# Patient Record
Sex: Female | Born: 1975 | Race: Black or African American | Hispanic: No | Marital: Single | Smoking: Current every day smoker
Health system: Southern US, Community
[De-identification: ages and names within clinical notes are randomized; demographics above are authoritative.]

## PROBLEM LIST (undated history)

## (undated) DIAGNOSIS — I10 Essential (primary) hypertension: Secondary | ICD-10-CM

## (undated) DIAGNOSIS — E785 Hyperlipidemia, unspecified: Secondary | ICD-10-CM

## (undated) DIAGNOSIS — J45909 Unspecified asthma, uncomplicated: Secondary | ICD-10-CM

## (undated) DIAGNOSIS — K5900 Constipation, unspecified: Secondary | ICD-10-CM

## (undated) DIAGNOSIS — E119 Type 2 diabetes mellitus without complications: Secondary | ICD-10-CM

## (undated) DIAGNOSIS — M549 Dorsalgia, unspecified: Secondary | ICD-10-CM

## (undated) DIAGNOSIS — K5909 Other constipation: Secondary | ICD-10-CM

## (undated) DIAGNOSIS — Z973 Presence of spectacles and contact lenses: Secondary | ICD-10-CM

## (undated) HISTORY — DX: Type 2 diabetes mellitus without complications: E11.9

## (undated) HISTORY — PX: HYSTEROSCOPY WITH NOVASURE: SHX5574

## (undated) HISTORY — DX: Essential (primary) hypertension: I10

## (undated) HISTORY — PX: ESSURE TUBAL LIGATION: SUR464

## (undated) HISTORY — DX: Hyperlipidemia, unspecified: E78.5

---

## 1998-02-25 ENCOUNTER — Encounter: Admission: RE | Admit: 1998-02-25 | Discharge: 1998-02-25 | Payer: Self-pay | Admitting: *Deleted

## 1999-03-06 ENCOUNTER — Emergency Department (HOSPITAL_COMMUNITY): Admission: EM | Admit: 1999-03-06 | Discharge: 1999-03-06 | Payer: Self-pay | Admitting: Emergency Medicine

## 1999-08-24 HISTORY — PX: COLPOSCOPY: SHX161

## 2009-11-21 ENCOUNTER — Emergency Department (HOSPITAL_COMMUNITY): Admission: EM | Admit: 2009-11-21 | Discharge: 2009-11-21 | Payer: Self-pay | Admitting: Family Medicine

## 2010-01-07 ENCOUNTER — Ambulatory Visit: Payer: Self-pay | Admitting: Obstetrics and Gynecology

## 2010-01-08 ENCOUNTER — Encounter: Payer: Self-pay | Admitting: Obstetrics and Gynecology

## 2010-01-08 LAB — CONVERTED CEMR LAB
Trich, Wet Prep: NONE SEEN
Yeast Wet Prep HPF POC: NONE SEEN

## 2010-01-12 ENCOUNTER — Ambulatory Visit (HOSPITAL_COMMUNITY): Admission: RE | Admit: 2010-01-12 | Discharge: 2010-01-12 | Payer: Self-pay | Admitting: Obstetrics & Gynecology

## 2010-02-12 ENCOUNTER — Ambulatory Visit: Payer: Self-pay | Admitting: Obstetrics & Gynecology

## 2010-10-16 ENCOUNTER — Emergency Department (HOSPITAL_COMMUNITY)
Admission: EM | Admit: 2010-10-16 | Discharge: 2010-10-16 | Disposition: A | Discharge status: Home / Self Care | Payer: Self-pay | Attending: Emergency Medicine | Admitting: Emergency Medicine

## 2010-10-16 ENCOUNTER — Emergency Department (HOSPITAL_COMMUNITY): Payer: Self-pay

## 2010-10-16 DIAGNOSIS — R0989 Other specified symptoms and signs involving the circulatory and respiratory systems: Secondary | ICD-10-CM | POA: Insufficient documentation

## 2010-10-16 DIAGNOSIS — R0609 Other forms of dyspnea: Secondary | ICD-10-CM | POA: Insufficient documentation

## 2010-10-16 DIAGNOSIS — I10 Essential (primary) hypertension: Secondary | ICD-10-CM | POA: Insufficient documentation

## 2010-10-16 DIAGNOSIS — J069 Acute upper respiratory infection, unspecified: Secondary | ICD-10-CM | POA: Insufficient documentation

## 2010-10-16 DIAGNOSIS — B9789 Other viral agents as the cause of diseases classified elsewhere: Secondary | ICD-10-CM | POA: Insufficient documentation

## 2010-10-16 DIAGNOSIS — R0789 Other chest pain: Secondary | ICD-10-CM | POA: Insufficient documentation

## 2010-10-16 DIAGNOSIS — R059 Cough, unspecified: Secondary | ICD-10-CM | POA: Insufficient documentation

## 2010-10-16 DIAGNOSIS — R112 Nausea with vomiting, unspecified: Secondary | ICD-10-CM | POA: Insufficient documentation

## 2010-10-16 DIAGNOSIS — R05 Cough: Secondary | ICD-10-CM | POA: Insufficient documentation

## 2010-10-16 DIAGNOSIS — R0602 Shortness of breath: Secondary | ICD-10-CM | POA: Insufficient documentation

## 2010-10-16 LAB — URINALYSIS, ROUTINE W REFLEX MICROSCOPIC
Bilirubin Urine: NEGATIVE
Ketones, ur: NEGATIVE mg/dL
Specific Gravity, Urine: 1.015 (ref 1.005–1.030)
Urobilinogen, UA: 0.2 mg/dL (ref 0.0–1.0)

## 2010-10-21 ENCOUNTER — Emergency Department (HOSPITAL_COMMUNITY)
Admission: EM | Admit: 2010-10-21 | Discharge: 2010-10-21 | Disposition: A | Discharge status: Home / Self Care | Payer: Self-pay | Attending: Emergency Medicine | Admitting: Emergency Medicine

## 2010-10-21 DIAGNOSIS — I10 Essential (primary) hypertension: Secondary | ICD-10-CM | POA: Insufficient documentation

## 2010-10-21 DIAGNOSIS — R112 Nausea with vomiting, unspecified: Secondary | ICD-10-CM | POA: Insufficient documentation

## 2010-10-21 DIAGNOSIS — R040 Epistaxis: Secondary | ICD-10-CM | POA: Insufficient documentation

## 2010-10-21 DIAGNOSIS — J40 Bronchitis, not specified as acute or chronic: Secondary | ICD-10-CM | POA: Insufficient documentation

## 2010-10-21 DIAGNOSIS — R05 Cough: Secondary | ICD-10-CM | POA: Insufficient documentation

## 2010-10-21 DIAGNOSIS — R059 Cough, unspecified: Secondary | ICD-10-CM | POA: Insufficient documentation

## 2010-10-30 ENCOUNTER — Inpatient Hospital Stay (INDEPENDENT_AMBULATORY_CARE_PROVIDER_SITE_OTHER)
Admission: RE | Admit: 2010-10-30 | Discharge: 2010-10-30 | Disposition: A | Discharge status: Home / Self Care | Payer: Self-pay | Source: Ambulatory Visit | Attending: Emergency Medicine | Admitting: Emergency Medicine

## 2010-10-30 DIAGNOSIS — H8110 Benign paroxysmal vertigo, unspecified ear: Secondary | ICD-10-CM

## 2011-08-24 DIAGNOSIS — F419 Anxiety disorder, unspecified: Secondary | ICD-10-CM

## 2011-08-24 HISTORY — DX: Anxiety disorder, unspecified: F41.9

## 2012-03-13 ENCOUNTER — Other Ambulatory Visit: Payer: Self-pay

## 2012-03-13 MED ORDER — FLUCONAZOLE 150 MG PO TABS: 150.0000 mg | ORAL_TABLET | Freq: Once | ORAL | Status: AC

## 2012-03-13 MED ORDER — METRONIDAZOLE 500 MG PO TABS: 500.0000 mg | ORAL_TABLET | Freq: Two times a day (BID) | ORAL | Status: AC

## 2012-03-13 NOTE — Telephone Encounter (Signed)
Pt called and stated that she is having a discharge with a fishy odor and she informed me that she usually has BV.  I advised pt that I would e-prescribe her flagyl for tx of BV and diflucan since pt stated that she usually gets a yeast infection after taking antibiotics.  I verified pharmacy with the pt.  Pt stated understanding and had no further questions.

## 2012-03-28 ENCOUNTER — Telehealth: Payer: Self-pay | Admitting: *Deleted

## 2012-03-28 NOTE — Telephone Encounter (Signed)
Pt left message stating that she has appt on 04/10/12 @ 1615. She has had a Mirena IUD x2 yrs. She  has been bleeding for 2 wks now and wants to know if there is anything she can get to stop the bleeding.  She has had this problem of irregular bleeding for a long time and wants it to stop. I called pt and left a message that she will need to be evaluated to best determine the proper care which may or may not be medication. There is an appt available on 8/8 @ 1530 [facility]) if she would like to change her appt please call to the appt desk and request the change.

## 2012-03-30 ENCOUNTER — Ambulatory Visit: Payer: Self-pay | Admitting: Physician Assistant

## 2012-04-10 ENCOUNTER — Ambulatory Visit: Payer: Self-pay | Admitting: Obstetrics & Gynecology

## 2012-04-28 ENCOUNTER — Ambulatory Visit: Payer: Self-pay | Admitting: Advanced Practice Midwife

## 2012-07-17 ENCOUNTER — Other Ambulatory Visit: Payer: Self-pay | Admitting: Obstetrics and Gynecology

## 2012-07-17 ENCOUNTER — Other Ambulatory Visit (HOSPITAL_COMMUNITY)
Admission: RE | Admit: 2012-07-17 | Discharge: 2012-07-17 | Disposition: A | Discharge status: Home / Self Care | Payer: Medicaid Other | Source: Ambulatory Visit | Attending: Obstetrics and Gynecology | Admitting: Obstetrics and Gynecology

## 2012-07-17 ENCOUNTER — Encounter: Payer: Self-pay | Admitting: Obstetrics and Gynecology

## 2012-07-17 ENCOUNTER — Ambulatory Visit (INDEPENDENT_AMBULATORY_CARE_PROVIDER_SITE_OTHER): Payer: Medicaid Other | Admitting: Obstetrics and Gynecology

## 2012-07-17 VITALS — BP 119/79 | HR 90 | Temp 97.4°F | Ht 64.0 in | Wt 190.0 lb

## 2012-07-17 DIAGNOSIS — Z01419 Encounter for gynecological examination (general) (routine) without abnormal findings: Secondary | ICD-10-CM

## 2012-07-17 DIAGNOSIS — Z1231 Encounter for screening mammogram for malignant neoplasm of breast: Secondary | ICD-10-CM

## 2012-07-17 DIAGNOSIS — Z803 Family history of malignant neoplasm of breast: Secondary | ICD-10-CM

## 2012-07-17 DIAGNOSIS — N760 Acute vaginitis: Secondary | ICD-10-CM | POA: Insufficient documentation

## 2012-07-17 DIAGNOSIS — Z8742 Personal history of other diseases of the female genital tract: Secondary | ICD-10-CM

## 2012-07-17 DIAGNOSIS — F17200 Nicotine dependence, unspecified, uncomplicated: Secondary | ICD-10-CM | POA: Insufficient documentation

## 2012-07-17 DIAGNOSIS — Z1151 Encounter for screening for human papillomavirus (HPV): Secondary | ICD-10-CM | POA: Insufficient documentation

## 2012-07-17 DIAGNOSIS — Z87898 Personal history of other specified conditions: Secondary | ICD-10-CM

## 2012-07-17 DIAGNOSIS — Z113 Encounter for screening for infections with a predominantly sexual mode of transmission: Secondary | ICD-10-CM | POA: Insufficient documentation

## 2012-07-17 NOTE — Patient Instructions (Addendum)
Smoking Cessation Quitting smoking is important to your health and has many advantages. However, it is not always easy to quit since nicotine is a very addictive drug. Often times, people try 3 times or more before being able to quit. This document explains the best ways for you to prepare to quit smoking. Quitting takes hard work and a lot of effort, but you can do it. ADVANTAGES OF QUITTING SMOKING  You will live longer, feel better, and live better.  Your body will feel the impact of quitting smoking almost immediately.  Within 20 minutes, blood pressure decreases. Your pulse returns to its normal level.  After 8 hours, carbon monoxide levels in the blood return to normal. Your oxygen level increases.  After 24 hours, the chance of having a heart attack starts to decrease. Your breath, hair, and body stop smelling like smoke.  After 48 hours, damaged nerve endings begin to recover. Your sense of taste and smell improve.  After 72 hours, the body is virtually free of nicotine. Your bronchial tubes relax and breathing becomes easier.  After 2 to 12 weeks, lungs can hold more air. Exercise becomes easier and circulation improves.  The risk of having a heart attack, stroke, cancer, or lung disease is greatly reduced.  After 1 year, the risk of coronary heart disease is cut in half.  After 5 years, the risk of stroke falls to the same as a nonsmoker.  After 10 years, the risk of lung cancer is cut in half and the risk of other cancers decreases significantly.  After 15 years, the risk of coronary heart disease drops, usually to the level of a nonsmoker.  If you are pregnant, quitting smoking will improve your chances of having a healthy baby.  The people you live with, especially any children, will be healthier.  You will have extra money to spend on things other than cigarettes. QUESTIONS TO THINK ABOUT BEFORE ATTEMPTING TO QUIT You may want to talk about your answers with your  caregiver.  Why do you want to quit?  If you tried to quit in the past, what helped and what did not?  What will be the most difficult situations for you after you quit? How will you plan to handle them?  Who can help you through the tough times? Your family? Friends? A caregiver?  What pleasures do you get from smoking? What ways can you still get pleasure if you quit? Here are some questions to ask your caregiver:  How can you help me to be successful at quitting?  What medicine do you think would be best for me and how should I take it?  What should I do if I need more help?  What is smoking withdrawal like? How can I get information on withdrawal? GET READY  Set a quit date.  Change your environment by getting rid of all cigarettes, ashtrays, matches, and lighters in your home, car, or work. Do not let people smoke in your home.  Review your past attempts to quit. Think about what worked and what did not. GET SUPPORT AND ENCOURAGEMENT You have a better chance of being successful if you have help. You can get support in many ways.  Tell your family, friends, and co-workers that you are going to quit and need their support. Ask them not to smoke around you.  Get individual, group, or telephone counseling and support. Programs are available at local hospitals and health centers. Call your local health department for   information about programs in your area.  Spiritual beliefs and practices may help some smokers quit.  Download a "quit meter" on your computer to keep track of quit statistics, such as how long you have gone without smoking, cigarettes not smoked, and money saved.  Get a self-help book about quitting smoking and staying off of tobacco. LEARN NEW SKILLS AND BEHAVIORS  Distract yourself from urges to smoke. Talk to someone, go for a walk, or occupy your time with a task.  Change your normal routine. Take a different route to work. Drink tea instead of coffee.  Eat breakfast in a different place.  Reduce your stress. Take a hot bath, exercise, or read a book.  Plan something enjoyable to do every day. Reward yourself for not smoking.  Explore interactive web-based programs that specialize in helping you quit. GET MEDICINE AND USE IT CORRECTLY Medicines can help you stop smoking and decrease the urge to smoke. Combining medicine with the above behavioral methods and support can greatly increase your chances of successfully quitting smoking.  Nicotine replacement therapy helps deliver nicotine to your body without the negative effects and risks of smoking. Nicotine replacement therapy includes nicotine gum, lozenges, inhalers, nasal sprays, and skin patches. Some may be available over-the-counter and others require a prescription.  Antidepressant medicine helps people abstain from smoking, but how this works is unknown. This medicine is available by prescription.  Nicotinic receptor partial agonist medicine simulates the effect of nicotine in your brain. This medicine is available by prescription. Ask your caregiver for advice about which medicines to use and how to use them based on your health history. Your caregiver will tell you what side effects to look out for if you choose to be on a medicine or therapy. Carefully read the information on the package. Do not use any other product containing nicotine while using a nicotine replacement product.  RELAPSE OR DIFFICULT SITUATIONS Most relapses occur within the first 3 months after quitting. Do not be discouraged if you start smoking again. Remember, most people try several times before finally quitting. You may have symptoms of withdrawal because your body is used to nicotine. You may crave cigarettes, be irritable, feel very hungry, cough often, get headaches, or have difficulty concentrating. The withdrawal symptoms are only temporary. They are strongest when you first quit, but they will go away within  10 14 days. To reduce the chances of relapse, try to:  Avoid drinking alcohol. Drinking lowers your chances of successfully quitting.  Reduce the amount of caffeine you consume. Once you quit smoking, the amount of caffeine in your body increases and can give you symptoms, such as a rapid heartbeat, sweating, and anxiety.  Avoid smokers because they can make you want to smoke.  Do not let weight gain distract you. Many smokers will gain weight when they quit, usually less than 10 pounds. Eat a healthy diet and stay active. You can always lose the weight gained after you quit.  Find ways to improve your mood other than smoking. FOR MORE INFORMATION  www.smokefree.gov  Document Released: 08/03/2001 Document Revised: 02/08/2012 Document Reviewed: 11/18/2011 [facility] Patient Information 2013 [place], [place]. Mammogram Tips Healthy women should begin getting mammograms every year or two once they reach age 14, and once a year when they reach age 23. Here are tips:  Find an experienced, high-volume center with accomplished radiologists. You can ask for their credentials.  Ask to see the certificate showing the center is approved by the [place]  Food and Drug Administration.  Use the same center regularly, so it is easier to compare your new mammograms with your old ones.  Bring a list of places you have had mammograms, dates, biopsies or other breast treatments. Bring old mammograms with you or have them sent to your primary caregiver.  Describe any breast problems to your caregiver or the person doing the mammogram. Be ready to give past surgeries, birth control pills, hormone use, breast implants, growths, moles, breast scars and family or personal history of breast cancer.  Call your doctor or center to check on the mammogram if you hear nothing within 10 days. Do not assume everything was normal.  To protect your privacy, the mammogram results cannot be given over the phone or to anyone but  you.  Radiation from a mammogram is very low and does not pose a radiation risk.  Mammograms can detect breast problems other than breast cancer.  You may be asked stand or sit in front of the X-ray machine.  Two small plastic or glass plates are placed around the breast when taking the X-ray.  If you are menstruating, schedule your mammogram a week after your menstrual period.  Do not wear deodorants, powder or perfume when getting a mammogram.  Wash your breasts and under your arms before getting a mammogram.  Wear cloths that are easy for you to undress and dress.  Arrive at the center at least 15 minutes before the mammogram is scheduled.  There may be slight discomfort during the mammogram, but it goes away shortly after the test.  Try to relax as much as possible during the mammogram.  Talk to your caregiver if you do not understand the results of the mammogram.  Follow the recommendations of your caregiver regarding further tests and treatments if needed.  Get a second opinion if you are concerned or question the results of the mammogram, further tests or treatment if needed.  Continue with monthly self-breast exams and yearly caregiver exams even if the mammogram is normal.  Your caregiver may recommend getting a mammogram before age 4 and more often if you are at high risk for developing breast cancer. Document Released: 11/25/2005 Document Revised: 11/01/2011 Document Reviewed: 08/02/2008 [facility] Patient Information 2013 [place], [place].

## 2012-07-18 LAB — HIV ANTIBODY (ROUTINE TESTING W REFLEX): HIV: NONREACTIVE

## 2012-07-18 NOTE — Progress Notes (Signed)
CC: Gynecologic Exam e     HPI Michaela Thomas is a 36 y.o. [id number]  who presents for Pap and GYN exam. States she had colpo for abnormal Pap in 2001 and in 2011 and last Pap a year ago was normal. Desires GC/CT, HIV also. Also has never had baseline screening mammogram or BRCA genetic testing and mother had breast Ca in her 49's. Smoker somewhat motivated to quit. Has had Mirena for about a year and has light periods. LMP 3-4 wks ago.  Denies irritative vaginal discharge or new sexual partner.  Hx of elevated BP and was on HCTZ in past. In process of finding PMD as she just moved back here.    Past Medical History  Diagnosis Date  . Hypertension   FH significant for mother with breast cancer at age 61.  OB History    Grav Para Term Preterm Abortions TAB SAB Ect Mult Living   2 2 2  0 0 0 0 0 0 2     # Outc Date GA Lbr Len/2nd Wgt Sex Del Anes PTL Lv   1 TRM    6lb(2.722kg)  SVD      2 TRM    6lb(2.722kg)  SVD         Past Surgical History  Procedure Date  . Colposcopy 2001    History   Social History  . Marital Status: Single    Spouse Name: N/A    Number of Children: N/A  . Years of Education: N/A   Occupational History  . Not on file.   Social History Main Topics  . Smoking status: Current Every Day Smoker    Types: Cigarettes  . Smokeless tobacco: Never Used  . Alcohol Use: 1.0 oz/week    2 drink(s) per week  . Drug Use: No  . Sexually Active: Yes    Birth Control/ Protection: IUD   Other Topics Concern  . Not on file   Social History Narrative  . No narrative on file    Current Outpatient Prescriptions on File Prior to Visit  Medication Sig Dispense Refill  . hydrochlorothiazide (HYDRODIURIL) 25 MG tablet Take 25 mg by mouth daily.        No Known Allergies  ROS Pertinent items in HPI  PHYSICAL EXAM Filed Vitals:   07/17/12 1559  BP: 119/79  Pulse:   Temp:    General: Well nourished, well developed female in no acute distress Cardiovascular:  Normal rate Respiratory: Normal effort Abdomen: Soft, nontender Back: No CVAT Extremities: No edema Neurologic: Alert and oriented Speculum exam: NEFG; vagina with physiologic discharge, small amt dark blood from os with Pap blood; cervix clean with Mirena string 1 cm past ext os Bimanual exam: cervix closed, no CMT; uterus NSSP; no adnexal tenderness or masses  LAB RESULTS Results for orders placed in visit on 07/17/12 (from the past 24 hour(s))  HIV ANTIBODY (ROUTINE TESTING)     Status: Normal   Collection Time   07/17/12  4:59 PM      Component Value Range   HIV NON REACTIVE  NON REACTIVE   Narrative:    Performed at:  [facility]                [address]                [place], [place] [address]    I ASSESSMENT  1. Smoker   2. Family history of breast cancer   3.  History of abnormal Pap smear   Hx BP elevation  PLAN Pap, GC/CT, HIV done. See AVS for patient education. Information on BRCA genetic testing given Return 1 year or prn.  [name], CNM 07/18/2012 9:42 AM

## 2012-08-03 ENCOUNTER — Ambulatory Visit (HOSPITAL_COMMUNITY)
Admission: RE | Admit: 2012-08-03 | Discharge: 2012-08-03 | Disposition: A | Discharge status: Home / Self Care | Payer: Medicaid Other | Source: Ambulatory Visit | Attending: Obstetrics and Gynecology | Admitting: Obstetrics and Gynecology

## 2012-08-03 DIAGNOSIS — Z1231 Encounter for screening mammogram for malignant neoplasm of breast: Secondary | ICD-10-CM | POA: Insufficient documentation

## 2012-08-03 DIAGNOSIS — Z803 Family history of malignant neoplasm of breast: Secondary | ICD-10-CM

## 2012-08-10 ENCOUNTER — Other Ambulatory Visit: Payer: Self-pay | Admitting: Obstetrics and Gynecology

## 2012-08-10 DIAGNOSIS — R928 Other abnormal and inconclusive findings on diagnostic imaging of breast: Secondary | ICD-10-CM

## 2012-08-21 ENCOUNTER — Other Ambulatory Visit: Payer: Medicaid Other

## 2012-08-30 ENCOUNTER — Emergency Department (HOSPITAL_COMMUNITY)
Admission: EM | Admit: 2012-08-30 | Discharge: 2012-08-30 | Disposition: A | Discharge status: Home / Self Care | Payer: 59 | Attending: Emergency Medicine | Admitting: Emergency Medicine

## 2012-08-30 ENCOUNTER — Emergency Department (HOSPITAL_COMMUNITY): Payer: 59

## 2012-08-30 ENCOUNTER — Encounter (HOSPITAL_COMMUNITY): Payer: Self-pay | Admitting: Emergency Medicine

## 2012-08-30 DIAGNOSIS — J111 Influenza due to unidentified influenza virus with other respiratory manifestations: Secondary | ICD-10-CM

## 2012-08-30 DIAGNOSIS — F17200 Nicotine dependence, unspecified, uncomplicated: Secondary | ICD-10-CM | POA: Insufficient documentation

## 2012-08-30 DIAGNOSIS — R05 Cough: Secondary | ICD-10-CM | POA: Insufficient documentation

## 2012-08-30 DIAGNOSIS — R0602 Shortness of breath: Secondary | ICD-10-CM | POA: Insufficient documentation

## 2012-08-30 DIAGNOSIS — I10 Essential (primary) hypertension: Secondary | ICD-10-CM | POA: Insufficient documentation

## 2012-08-30 DIAGNOSIS — R059 Cough, unspecified: Secondary | ICD-10-CM | POA: Insufficient documentation

## 2012-08-30 MED ORDER — KETOROLAC TROMETHAMINE 60 MG/2ML IM SOLN
60.0000 mg | Freq: Once | INTRAMUSCULAR | Status: AC
  Administered 2012-08-30: 60 mg via INTRAMUSCULAR
  Filled 2012-08-30: qty 2

## 2012-08-30 MED ORDER — NAPROXEN 500 MG PO TABS: 500.0000 mg | ORAL_TABLET | Freq: Two times a day (BID) | ORAL | Status: DC

## 2012-08-30 MED ORDER — OSELTAMIVIR PHOSPHATE 75 MG PO CAPS: 75.0000 mg | ORAL_CAPSULE | Freq: Two times a day (BID) | ORAL | Status: DC

## 2012-08-30 NOTE — ED Provider Notes (Signed)
History     CSN: [id number]  Arrival date & time 08/30/12  0251   First MD Initiated Contact with Patient 08/30/12 [phone]      Chief Complaint  Patient presents with  . Cough  . Shortness of Breath    (Consider location/radiation/quality/duration/timing/severity/associated sxs/prior treatment) HPI Comments: 37 year old female who presents with approximately one day of productive cough, sore throat and shortness of breath who has now developed approximately 2 hours of myalgias. Her symptoms are constant, gradually worsening, not associated with fever though she has had subjective chills. She denies nausea vomiting diarrhea swelling rashes numbness. She does have a mild headache. Nothing seems to make this better or worse. Her symptoms were gradual in onset.  Patient is a 37 y.o. female presenting with cough and shortness of breath. The history is provided by the patient.  Cough Associated symptoms include shortness of breath.  Shortness of Breath  Associated symptoms include cough and shortness of breath.    Past Medical History  Diagnosis Date  . Hypertension     Past Surgical History  Procedure Date  . Colposcopy 2001    Family History  Problem Relation Age of Onset  . Cancer Mother 52    breast  . Cancer Father     prostate  . Hypertension Father   . Diabetes Father     History  Substance Use Topics  . Smoking status: Current Every Day Smoker    Types: Cigarettes  . Smokeless tobacco: Never Used  . Alcohol Use: 1.0 oz/week    2 drink(s) per week    OB History    Grav Para Term Preterm Abortions TAB SAB Ect Mult Living   2 2 2  0 0 0 0 0 0 2      Review of Systems  Respiratory: Positive for cough and shortness of breath.   All other systems reviewed and are negative.    Allergies  Review of patient's allergies indicates no known allergies.  Home Medications   Current Outpatient Rx  Name  Route  Sig  Dispense  Refill  . HYDROCHLOROTHIAZIDE 25 MG PO  TABS   Oral   Take 25 mg by mouth daily.         Marland Kitchen NAPROXEN 500 MG PO TABS   Oral   Take 1 tablet (500 mg total) by mouth 2 (two) times daily with a meal.   30 tablet   0   . OSELTAMIVIR PHOSPHATE 75 MG PO CAPS   Oral   Take 1 capsule (75 mg total) by mouth every 12 (twelve) hours.   10 capsule   0     BP 122/80  Pulse 61  Temp 99.1 F (37.3 C) (Oral)  Resp 18  SpO2 98%  LMP 08/06/2012  Physical Exam  Nursing note and vitals reviewed. Constitutional: She appears well-developed and well-nourished. No distress.  HENT:  Head: Normocephalic and atraumatic.  Mouth/Throat: No oropharyngeal exudate.       Mild erythema of the posterior pharynx, no exudate hypertrophy asymmetry. Nasal passages are clear, mucous membranes are moist. Tympanic membranes are clear.  Eyes: Conjunctivae normal and EOM are normal. Pupils are equal, round, and reactive to light. Right eye exhibits no discharge. Left eye exhibits no discharge. No scleral icterus.  Neck: Normal range of motion. Neck supple. No JVD present. No thyromegaly present.  Cardiovascular: Normal rate, regular rhythm, normal heart sounds and intact distal pulses.  Exam reveals no gallop and no friction rub.   No  murmur heard. Pulmonary/Chest: Effort normal and breath sounds normal. No respiratory distress. She has no wheezes. She has no rales.  Abdominal: Soft. Bowel sounds are normal. She exhibits no distension and no mass. There is no tenderness.  Musculoskeletal: Normal range of motion. She exhibits no edema and no tenderness.  Lymphadenopathy:    She has no cervical adenopathy.  Neurological: She is alert. Coordination normal.  Skin: Skin is warm and dry. No rash noted. No erythema.  Psychiatric: She has a normal mood and affect. Her behavior is normal.    ED Course  Procedures (including critical care time)  Labs Reviewed - No data to display Dg Chest 2 View  08/30/2012  *RADIOLOGY REPORT*  Clinical Data: Cough and  chest pain.  CHEST - 2 VIEW  Comparison: PA and lateral chest 10/16/2010.  Findings: Lungs clear.  Heart size normal.  No pneumothorax or pleural fluid.  IMPRESSION: Negative chest.   Original Report Authenticated By: Holley Dexter, M.D.      1. Influenza-like illness       MDM  Lungs are clear, no wheezing rales or rhonchi, no increased work of breathing and oxygen saturations are 100%. She does have a pharyngitis, she has a cough and now she has myalgias. Her symptoms are consistent with an influenza-like illness. She will be given Tamiflu prescription to use as needed, Toradol, home. She understands indications for return.  PA and lateral views of the chest were obtained by digital radiography. I have personally interpreted these x-rays and find her to be no signs of pulmonary infiltrate, cardiomegaly, subdiaphragmatic free air, soft tissue abnormality, no obvious bony abnormalities or fractures.      Vida Roller, MD 08/30/12 [phone]

## 2012-08-30 NOTE — ED Notes (Signed)
Patient complaining of sore throat, productive cough (with green mucous production), shortness of breath upon exertion, and earache.  Patient reports history of asthmatic bronchitis.

## 2012-09-12 ENCOUNTER — Other Ambulatory Visit: Payer: Medicaid Other

## 2012-09-26 ENCOUNTER — Ambulatory Visit
Admission: RE | Admit: 2012-09-26 | Discharge: 2012-09-26 | Disposition: A | Discharge status: Home / Self Care | Payer: Medicaid Other | Source: Ambulatory Visit | Attending: Obstetrics and Gynecology | Admitting: Obstetrics and Gynecology

## 2012-09-26 ENCOUNTER — Ambulatory Visit
Admission: RE | Admit: 2012-09-26 | Discharge: 2012-09-26 | Disposition: A | Discharge status: Home / Self Care | Payer: 59 | Source: Ambulatory Visit | Attending: Obstetrics and Gynecology | Admitting: Obstetrics and Gynecology

## 2012-09-26 DIAGNOSIS — R928 Other abnormal and inconclusive findings on diagnostic imaging of breast: Secondary | ICD-10-CM

## 2013-09-18 ENCOUNTER — Emergency Department (HOSPITAL_BASED_OUTPATIENT_CLINIC_OR_DEPARTMENT_OTHER)
Admission: EM | Admit: 2013-09-18 | Discharge: 2013-09-18 | Discharge status: Against Medical Advice | Payer: Medicaid Other | Attending: Emergency Medicine | Admitting: Emergency Medicine

## 2013-09-18 ENCOUNTER — Encounter (HOSPITAL_BASED_OUTPATIENT_CLINIC_OR_DEPARTMENT_OTHER): Payer: Self-pay | Admitting: Emergency Medicine

## 2013-09-18 DIAGNOSIS — I10 Essential (primary) hypertension: Secondary | ICD-10-CM | POA: Insufficient documentation

## 2013-09-18 DIAGNOSIS — F17200 Nicotine dependence, unspecified, uncomplicated: Secondary | ICD-10-CM | POA: Insufficient documentation

## 2013-09-18 DIAGNOSIS — R51 Headache: Secondary | ICD-10-CM | POA: Insufficient documentation

## 2013-09-18 NOTE — ED Notes (Signed)
Pt c/o h/a x 4 hrs

## 2013-10-08 DIAGNOSIS — B009 Herpesviral infection, unspecified: Secondary | ICD-10-CM | POA: Insufficient documentation

## 2013-12-06 ENCOUNTER — Other Ambulatory Visit: Payer: Self-pay

## 2013-12-06 DIAGNOSIS — Z1231 Encounter for screening mammogram for malignant neoplasm of breast: Secondary | ICD-10-CM

## 2013-12-20 ENCOUNTER — Ambulatory Visit
Admission: RE | Admit: 2013-12-20 | Discharge: 2013-12-20 | Disposition: A | Discharge status: Home / Self Care | Payer: 59 | Source: Ambulatory Visit

## 2013-12-20 DIAGNOSIS — Z1231 Encounter for screening mammogram for malignant neoplasm of breast: Secondary | ICD-10-CM

## 2014-06-24 ENCOUNTER — Encounter (HOSPITAL_BASED_OUTPATIENT_CLINIC_OR_DEPARTMENT_OTHER): Payer: Self-pay | Admitting: Emergency Medicine

## 2014-12-04 ENCOUNTER — Ambulatory Visit: Payer: Medicaid Other | Admitting: Obstetrics & Gynecology

## 2015-02-19 ENCOUNTER — Other Ambulatory Visit (HOSPITAL_COMMUNITY): Payer: Self-pay | Admitting: Obstetrics and Gynecology

## 2015-02-19 DIAGNOSIS — Z308 Encounter for other contraceptive management: Secondary | ICD-10-CM

## 2015-05-14 ENCOUNTER — Ambulatory Visit (HOSPITAL_COMMUNITY)
Admission: RE | Admit: 2015-05-14 | Discharge: 2015-05-14 | Disposition: A | Discharge status: Home / Self Care | Payer: PRIVATE HEALTH INSURANCE | Source: Ambulatory Visit | Attending: Obstetrics and Gynecology | Admitting: Obstetrics and Gynecology

## 2015-07-09 ENCOUNTER — Ambulatory Visit: Payer: PRIVATE HEALTH INSURANCE | Admitting: Physician Assistant

## 2015-08-24 HISTORY — PX: ESSURE TUBAL LIGATION: SUR464

## 2016-04-30 ENCOUNTER — Emergency Department (HOSPITAL_COMMUNITY)
Admission: EM | Admit: 2016-04-30 | Discharge: 2016-04-30 | Disposition: A | Discharge status: Home / Self Care | Payer: No Typology Code available for payment source | Attending: Dermatology | Admitting: Dermatology

## 2016-04-30 ENCOUNTER — Encounter (HOSPITAL_COMMUNITY): Payer: Self-pay | Admitting: *Deleted

## 2016-04-30 DIAGNOSIS — M542 Cervicalgia: Secondary | ICD-10-CM | POA: Diagnosis not present

## 2016-04-30 DIAGNOSIS — M545 Low back pain: Secondary | ICD-10-CM | POA: Diagnosis present

## 2016-04-30 DIAGNOSIS — F17210 Nicotine dependence, cigarettes, uncomplicated: Secondary | ICD-10-CM | POA: Diagnosis not present

## 2016-04-30 DIAGNOSIS — Y939 Activity, unspecified: Secondary | ICD-10-CM | POA: Diagnosis not present

## 2016-04-30 DIAGNOSIS — Y92410 Unspecified street and highway as the place of occurrence of the external cause: Secondary | ICD-10-CM | POA: Diagnosis not present

## 2016-04-30 DIAGNOSIS — Z5321 Procedure and treatment not carried out due to patient leaving prior to being seen by health care provider: Secondary | ICD-10-CM | POA: Diagnosis not present

## 2016-04-30 DIAGNOSIS — Y999 Unspecified external cause status: Secondary | ICD-10-CM | POA: Insufficient documentation

## 2016-04-30 DIAGNOSIS — I10 Essential (primary) hypertension: Secondary | ICD-10-CM | POA: Diagnosis not present

## 2016-04-30 NOTE — ED Notes (Signed)
Pt states she will come back tomorrow if she needs to,a mbulated out of ER without issue.
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
## 2016-04-30 NOTE — ED Notes (Signed)
Unable to locate pt. at subwaiting area several times .

## 2016-07-21 ENCOUNTER — Ambulatory Visit: Payer: Self-pay | Admitting: Physician Assistant

## 2016-07-21 ENCOUNTER — Encounter: Payer: Self-pay | Admitting: Physician Assistant

## 2016-07-21 VITALS — BP 140/90 | HR 74 | Temp 98.2°F

## 2016-07-21 DIAGNOSIS — J0100 Acute maxillary sinusitis, unspecified: Secondary | ICD-10-CM

## 2016-07-21 MED ORDER — FLUCONAZOLE 150 MG PO TABS: 150.0000 mg | ORAL_TABLET | Freq: Once | ORAL | 0 refills | Status: AC

## 2016-07-21 MED ORDER — AMOXICILLIN 875 MG PO TABS: 875.0000 mg | ORAL_TABLET | Freq: Two times a day (BID) | ORAL | 0 refills | Status: DC

## 2016-07-21 MED ORDER — PREDNISONE 10 MG PO TABS: 30.0000 mg | ORAL_TABLET | Freq: Every day | ORAL | 0 refills | Status: DC

## 2016-07-21 NOTE — Progress Notes (Signed)
S: C/o runny nose and congestion for 3 days, no fever, chills, cp/sob, v/d; mucus is green and thick, cough is sporadic, c/o of facial and dental pain.   Using otc meds:   O: PE: vitals wnl, nad, perrl eomi, normocephalic, tms dull, r tm is red and swollen, nasal mucosa red and swollen, throat injected, neck supple no lymph, lungs c t a, cv rrr, neuro intact  A:  Acute sinusitis, AOM r ear   P: drink fluids, continue regular meds , use otc meds of choice, return if not improving in 5 days, return earlier if worsening , amoxil, prednisone 30mg  qd x 3d, diflucan

## 2016-08-13 ENCOUNTER — Ambulatory Visit: Payer: Self-pay | Admitting: Physician Assistant

## 2016-08-13 VITALS — BP 120/80 | HR 82 | Temp 98.3°F

## 2016-08-13 DIAGNOSIS — R110 Nausea: Secondary | ICD-10-CM

## 2016-08-13 LAB — POCT URINE PREGNANCY: Preg Test, Ur: NEGATIVE

## 2016-08-13 MED ORDER — ONDANSETRON HCL 4 MG PO TABS: 4.0000 mg | ORAL_TABLET | Freq: Three times a day (TID) | ORAL | 0 refills | Status: DC | PRN

## 2016-08-13 MED ORDER — SUMATRIPTAN SUCCINATE 100 MG PO TABS: ORAL_TABLET | ORAL | 0 refills | Status: DC

## 2016-08-14 LAB — CMP12+LP+TP+TSH+6AC+CBC/D/PLT
ALBUMIN: 4.5 g/dL (ref 3.5–5.5)
ALT: 37 IU/L — AB (ref 0–32)
AST: 28 IU/L (ref 0–40)
Albumin/Globulin Ratio: 1.6 (ref 1.2–2.2)
Alkaline Phosphatase: 65 IU/L (ref 39–117)
BASOS: 0 %
BUN/Creatinine Ratio: 18 (ref 9–23)
BUN: 13 mg/dL (ref 6–20)
Basophils Absolute: 0 10*3/uL (ref 0.0–0.2)
Bilirubin Total: <0.2 mg/dL (ref 0.0–1.2)
CALCIUM: 9.5 mg/dL (ref 8.7–10.2)
CHLORIDE: 100 mmol/L (ref 96–106)
CHOLESTEROL TOTAL: 179 mg/dL (ref 100–199)
CREATININE: 0.74 mg/dL (ref 0.57–1.00)
Chol/HDL Ratio: 5.3 ratio units — ABNORMAL HIGH (ref 0.0–4.4)
EOS (ABSOLUTE): 0.1 10*3/uL (ref 0.0–0.4)
ESTIMATED CHD RISK: 1.4 times avg. — AB (ref 0.0–1.0)
Eos: 2 %
FREE THYROXINE INDEX: 2.1 (ref 1.2–4.9)
GFR calc Af Amer: 119 mL/min/{1.73_m2} (ref >59)
GFR, EST NON AFRICAN AMERICAN: 103 mL/min/{1.73_m2} (ref >59)
GGT: 40 IU/L (ref 0–60)
Globulin, Total: 2.9 g/dL (ref 1.5–4.5)
Glucose: 104 mg/dL — ABNORMAL HIGH (ref 65–99)
HDL: 34 mg/dL — ABNORMAL LOW (ref >39)
HEMATOCRIT: 38.6 % (ref 34.0–46.6)
Hemoglobin: 13.2 g/dL (ref 11.1–15.9)
IMMATURE GRANULOCYTES: 0 %
IRON: 84 ug/dL (ref 27–159)
Immature Grans (Abs): 0 10*3/uL (ref 0.0–0.1)
LDH: 164 IU/L (ref 119–226)
LDL CALC: 118 mg/dL — AB (ref 0–99)
LYMPHS ABS: 2.8 10*3/uL (ref 0.7–3.1)
Lymphs: 54 %
MCH: 27.7 pg (ref 26.6–33.0)
MCHC: 34.2 g/dL (ref 31.5–35.7)
MCV: 81 fL (ref 79–97)
MONOS ABS: 0.4 10*3/uL (ref 0.1–0.9)
Monocytes: 8 %
NEUTROS PCT: 36 %
Neutrophils Absolute: 1.8 10*3/uL (ref 1.4–7.0)
POTASSIUM: 3.9 mmol/L (ref 3.5–5.2)
Phosphorus: 2.7 mg/dL (ref 2.5–4.5)
Platelets: 443 10*3/uL — ABNORMAL HIGH (ref 150–379)
RBC: 4.76 x10E6/uL (ref 3.77–5.28)
RDW: 14.9 % (ref 12.3–15.4)
SODIUM: 139 mmol/L (ref 134–144)
T3 UPTAKE RATIO: 31 % (ref 24–39)
T4, Total: 6.7 ug/dL (ref 4.5–12.0)
TOTAL PROTEIN: 7.4 g/dL (ref 6.0–8.5)
TSH: 1.54 u[IU]/mL (ref 0.450–4.500)
Triglycerides: 134 mg/dL (ref 0–149)
Uric Acid: 7.7 mg/dL — ABNORMAL HIGH (ref 2.5–7.1)
VLDL Cholesterol Cal: 27 mg/dL (ref 5–40)
WBC: 5.1 10*3/uL (ref 3.4–10.8)

## 2016-08-14 LAB — VITAMIN D 25 HYDROXY (VIT D DEFICIENCY, FRACTURES): VIT D 25 HYDROXY: 13.3 ng/mL — AB (ref 30.0–100.0)

## 2016-08-14 LAB — LIPASE: LIPASE: 20 U/L (ref 14–72)

## 2016-08-17 LAB — POCT URINALYSIS DIPSTICK
BILIRUBIN UA: NEGATIVE
GLUCOSE UA: NEGATIVE
Ketones, UA: NEGATIVE
Leukocytes, UA: NEGATIVE
NITRITE UA: NEGATIVE
Protein, UA: NEGATIVE
RBC UA: NEGATIVE
Spec Grav, UA: 1.02
Urobilinogen, UA: 0.2
pH, UA: 6

## 2016-08-17 MED ORDER — VITAMIN D (ERGOCALCIFEROL) 1.25 MG (50000 UNIT) PO CAPS: 50000.0000 [IU] | ORAL_CAPSULE | ORAL | 3 refills | Status: AC

## 2016-08-17 NOTE — Addendum Note (Signed)
Addended by: Versie Starks on: 08/17/2016 09:11 AM   Modules accepted: Orders

## 2016-08-17 NOTE — Progress Notes (Addendum)
S: c/o nausea but no vomiting, states has been waking up with a headache, headache located around r eye, never had hx of migraines, recently treated for sinus infection on 11-29; sx for about 3 days, no fever/chills, no diarrhea  O: vitals wnl, nad, perrl eomi tms clear, throat wnl, neck supple no lymph, lungs c t a, cv rrr, abd soft nontender, bs normal, neuro intact, cn II-XII intact  A: headache, nausea  P: labs, zofran, imitrex rx for vit 50,000 q 7 d, sent to pt's pharmacy as vit d is 10

## 2016-08-23 HISTORY — PX: HYSTEROSCOPY WITH NOVASURE: SHX5574

## 2016-08-31 ENCOUNTER — Ambulatory Visit: Payer: Self-pay | Admitting: Physician Assistant

## 2016-08-31 ENCOUNTER — Encounter: Payer: Self-pay | Admitting: Physician Assistant

## 2016-08-31 VITALS — BP 110/80 | HR 80 | Temp 98.3°F | Ht 64.0 in | Wt 210.0 lb

## 2016-08-31 DIAGNOSIS — D75839 Thrombocytosis, unspecified: Secondary | ICD-10-CM

## 2016-08-31 DIAGNOSIS — R519 Headache, unspecified: Secondary | ICD-10-CM

## 2016-08-31 DIAGNOSIS — D473 Essential (hemorrhagic) thrombocythemia: Secondary | ICD-10-CM

## 2016-08-31 DIAGNOSIS — R51 Headache: Principal | ICD-10-CM

## 2016-08-31 NOTE — Progress Notes (Signed)
S: c/o continued headaches, some nausea associated with headaches, no v/, no sensitivity to light or sound, is worse upon waking, taking imitrex when its really bad, ?if working as she will fall asleep, taking otc execedrin migraine daily, no head trauma  O: vitals wnl, nad, perrl eomi, lungs c t a, cv rrr, cn II-XII intact, n/v intact  A: headache  P: mri brain

## 2016-08-31 NOTE — Addendum Note (Signed)
Addended by: Rudene Anda T on: 08/31/2016 02:18 PM   Modules accepted: Orders

## 2016-09-01 LAB — SPECIMEN STATUS REPORT

## 2016-09-02 LAB — CBC WITH DIFFERENTIAL/PLATELET
BASOS ABS: 0 10*3/uL (ref 0.0–0.2)
Basos: 0 %
EOS (ABSOLUTE): 0.1 10*3/uL (ref 0.0–0.4)
Eos: 2 %
HEMATOCRIT: 39 % (ref 34.0–46.6)
HEMOGLOBIN: 12.8 g/dL (ref 11.1–15.9)
IMMATURE GRANS (ABS): 0 10*3/uL (ref 0.0–0.1)
Immature Granulocytes: 0 %
Lymphocytes Absolute: 3.9 10*3/uL — ABNORMAL HIGH (ref 0.7–3.1)
Lymphs: 55 %
MCH: 27.6 pg (ref 26.6–33.0)
MCHC: 32.8 g/dL (ref 31.5–35.7)
MCV: 84 fL (ref 79–97)
MONOS ABS: 0.4 10*3/uL (ref 0.1–0.9)
Monocytes: 6 %
NEUTROS PCT: 37 %
Neutrophils Absolute: 2.6 10*3/uL (ref 1.4–7.0)
Platelets: 425 10*3/uL — ABNORMAL HIGH (ref 150–379)
RBC: 4.64 x10E6/uL (ref 3.77–5.28)
RDW: 14.9 % (ref 12.3–15.4)
WBC: 7.1 10*3/uL (ref 3.4–10.8)

## 2016-09-02 LAB — SPECIMEN STATUS REPORT

## 2016-09-02 NOTE — Addendum Note (Signed)
Addended by: Rudene Anda T on: 09/02/2016 01:52 PM   Modules accepted: Orders

## 2016-09-07 NOTE — Progress Notes (Signed)
Patient was referred to see Dr. Melrose Nakayama at [facility] on 10/05/16 @ 9am.  Patient has been notified and has accepted the appointment.

## 2016-09-20 NOTE — Progress Notes (Signed)
Hematology/Oncology Consult note [facility] Telephone:([phone] [phone]  Patient Care Team: Benito Mccreedy, MD as PCP - General (Internal Medicine)   Name of the patient: Michaela Thomas  [id number]  1976-02-19    Reason for referral- thrombocytosis   Referring physician- Dr. Jeneen Montgomery  Date of visit: 09/20/16   History of presenting illness- recent CBC from 08/31/2016 showed white count of 7.1, H&H of 12.8/39 and a platelet count of 425. One month ago CBC was unremarkable except for a platelet count of 443. Platelet count was 402 in December 2015. HIV test was nonreactive on 09/08/2015. Hepatitis B and hepatitis C testing was negative. Patient denies any h/o prior thromboses. Her mother died of PE in her 17's. Patient used to have daily headaches and nausea which have gotten better. Denies other complaints  ECOG PS- 0  Pain scale- 0   Review of systems- Review of Systems  Constitutional: Negative for chills, fever, malaise/fatigue and weight loss.  HENT: Negative for congestion, ear discharge and nosebleeds.   Eyes: Negative for blurred vision.  Respiratory: Negative for cough, hemoptysis, sputum production, shortness of breath and wheezing.   Cardiovascular: Negative for chest pain, palpitations, orthopnea and claudication.  Gastrointestinal: Negative for abdominal pain, blood in stool, constipation, diarrhea, heartburn, melena and vomiting.  Genitourinary: Negative for dysuria, flank pain, frequency, hematuria and urgency.  Musculoskeletal: Negative for back pain, joint pain and myalgias.  Skin: Negative for rash.  Neurological: Positive for headaches. Negative for dizziness, tingling, focal weakness, seizures and weakness.  Endo/Heme/Allergies: Does not bruise/bleed easily.  Psychiatric/Behavioral: Negative for depression and suicidal ideas. The patient does not have insomnia.     No Known Allergies  Patient Active Problem  List   Diagnosis Date Noted  . Smoker 07/17/2012  . Family history of breast cancer 07/17/2012  . History of abnormal Pap smear 07/17/2012     Past Medical History:  Diagnosis Date  . Hypertension      Past Surgical History:  Procedure Laterality Date  . COLPOSCOPY  2001    Social History   Social History  . Marital status: Single    Spouse name: N/A  . Number of children: N/A  . Years of education: N/A   Occupational History  . Not on file.   Social History Main Topics  . Smoking status: Current Every Day Smoker    Packs/day: 0.50    Years: 5.00    Types: Cigarettes  . Smokeless tobacco: Never Used     Comment: pt given phone number to main desk to ask for smoking cessation class info  . Alcohol use 1.0 oz/week    2 Standard drinks or equivalent per week  . Drug use: No  . Sexual activity: Yes    Birth control/ protection: IUD   Other Topics Concern  . Not on file   Social History Narrative  . No narrative on file     Family History  Problem Relation Age of Onset  . Cancer Mother 49    breast  . Cancer Father     prostate  . Hypertension Father   . Diabetes Father      Current Outpatient Prescriptions:  .  hydrochlorothiazide (HYDRODIURIL) 25 MG tablet, Take 25 mg by mouth daily., Disp: , Rfl:  .  ondansetron (ZOFRAN) 4 MG tablet, Take 1 tablet (4 mg total) by mouth every 8 (eight) hours as needed for nausea or vomiting., Disp: 20 tablet, Rfl: 0 .  SUMAtriptan (IMITREX)  100 MG tablet, Take one tablet at onset of headache, may repeat in 2 hours if needed, Disp: 10 tablet, Rfl: 0 .  Vitamin D, Ergocalciferol, (DRISDOL) 50000 units CAPS capsule, Take 1 capsule (50,000 Units total) by mouth every 7 (seven) days., Disp: 12 capsule, Rfl: 3   Physical exam:  Vitals:   09/21/16 1022  BP: (!) 147/105  Pulse: 73  Resp: 18  Temp: (!) 96.8 F (36 C)  TempSrc: Tympanic  Weight: 213 lb 6.5 oz (96.8 kg)   Physical Exam  Constitutional: She is oriented  to person, place, and time and well-developed, well-nourished, and in no distress.  HENT:  Head: Normocephalic and atraumatic.  Eyes: EOM are normal. Pupils are equal, round, and reactive to light.  Neck: Normal range of motion.  Cardiovascular: Normal rate, regular rhythm and normal heart sounds.   Pulmonary/Chest: Effort normal and breath sounds normal.  Abdominal: Soft. Bowel sounds are normal.  Neurological: She is alert and oriented to person, place, and time.  Skin: Skin is warm and dry.       CMP Latest Ref Rng & Units 08/13/2016  Glucose 65 - 99 mg/dL 104(H)  BUN 6 - 20 mg/dL 13  Creatinine 0.57 - 1.00 mg/dL 0.74  Sodium 134 - 144 mmol/L 139  Potassium 3.5 - 5.2 mmol/L 3.9  Chloride 96 - 106 mmol/L 100  Calcium 8.7 - 10.2 mg/dL 9.5  Total Protein 6.0 - 8.5 g/dL 7.4  Total Bilirubin 0.0 - 1.2 mg/dL <0.2  Alkaline Phos 39 - 117 IU/L 65  AST 0 - 40 IU/L 28  ALT 0 - 32 IU/L 37(H)   CBC Latest Ref Rng & Units 08/31/2016  WBC 3.4 - 10.8 x10E3/uL 7.1  Hematocrit 34.0 - 46.6 % 39.0  Platelets 150 - 379 x10E3/uL 425(H)    Assessment and plan- Patient is a 41 y.o. female who has been referred to [place] for evaluation of thrombocytosis  1. 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 At this point I would like to get iron studies ESR and CRP in addition to CBC with manual diff today. Get pathology review of her peripheral smear. I will see her back in 6 months time. I am not inclined to do further tests at this time given platelet count is <450. Will consider additional testing in the future to r/o primary hematological disorder if platelet count shows a consistent upward trend.    Thank you for this kind referral and the opportunity to participate in the care of this patient   Visit Diagnosis 1. Thrombocytosis ([place])     Dr. Randa Evens, MD, MPH [facility] at [facility] Pager-  [phone] 09/21/2016 11:27 AM

## 2016-09-21 ENCOUNTER — Inpatient Hospital Stay: Payer: Managed Care, Other (non HMO)

## 2016-09-21 ENCOUNTER — Encounter: Payer: Self-pay | Admitting: Oncology

## 2016-09-21 ENCOUNTER — Inpatient Hospital Stay: Payer: Managed Care, Other (non HMO) | Attending: Oncology | Admitting: Oncology

## 2016-09-21 VITALS — BP 147/105 | HR 73 | Temp 96.8°F | Resp 18 | Wt 213.4 lb

## 2016-09-21 DIAGNOSIS — D473 Essential (hemorrhagic) thrombocythemia: Secondary | ICD-10-CM

## 2016-09-21 DIAGNOSIS — D75839 Thrombocytosis, unspecified: Secondary | ICD-10-CM

## 2016-09-21 DIAGNOSIS — F17210 Nicotine dependence, cigarettes, uncomplicated: Secondary | ICD-10-CM

## 2016-09-21 DIAGNOSIS — R51 Headache: Secondary | ICD-10-CM | POA: Diagnosis not present

## 2016-09-21 DIAGNOSIS — Z803 Family history of malignant neoplasm of breast: Secondary | ICD-10-CM | POA: Diagnosis not present

## 2016-09-21 DIAGNOSIS — Z8042 Family history of malignant neoplasm of prostate: Secondary | ICD-10-CM

## 2016-09-21 DIAGNOSIS — I10 Essential (primary) hypertension: Secondary | ICD-10-CM

## 2016-09-21 DIAGNOSIS — Z79899 Other long term (current) drug therapy: Secondary | ICD-10-CM | POA: Diagnosis not present

## 2016-09-21 LAB — CBC
HCT: 38.3 % (ref 35.0–47.0)
Hemoglobin: 12.7 g/dL (ref 12.0–16.0)
MCH: 27.6 pg (ref 26.0–34.0)
MCHC: 33.3 g/dL (ref 32.0–36.0)
MCV: 82.9 fL (ref 80.0–100.0)
PLATELETS: 424 10*3/uL (ref 150–440)
RBC: 4.62 MIL/uL (ref 3.80–5.20)
RDW: 14.3 % (ref 11.5–14.5)
WBC: 6.7 10*3/uL (ref 3.6–11.0)

## 2016-09-21 LAB — DIFFERENTIAL
BASOS PCT: 0 %
Basophils Absolute: 0 10*3/uL (ref 0–0.1)
Eosinophils Absolute: 0.1 10*3/uL (ref 0–0.7)
Eosinophils Relative: 1 %
LYMPHS PCT: 49 %
Lymphs Abs: 3.3 10*3/uL (ref 1.0–3.6)
MONO ABS: 0.5 10*3/uL (ref 0.2–0.9)
Monocytes Relative: 8 %
NEUTROS ABS: 2.8 10*3/uL (ref 1.4–6.5)
Neutrophils Relative %: 42 %

## 2016-09-21 LAB — FERRITIN: FERRITIN: 45 ng/mL (ref 11–307)

## 2016-09-21 LAB — C-REACTIVE PROTEIN

## 2016-09-21 LAB — IRON AND TIBC
Iron: 76 ug/dL (ref 28–170)
SATURATION RATIOS: 25 % (ref 10.4–31.8)
TIBC: 306 ug/dL (ref 250–450)
UIBC: 230 ug/dL

## 2016-09-21 LAB — SEDIMENTATION RATE: Sed Rate: 11 mm/hr (ref 0–20)

## 2016-09-21 LAB — PATHOLOGIST SMEAR REVIEW

## 2016-09-21 NOTE — Progress Notes (Signed)
Here today for new pt evaluation. Stated additional problem is headaches she is going to see a neurlogist on feb 13/18. Feels good. Stated here by Alvia [place] after labs x 2 and elevated platelets. Here for eval  Today bp elevated and per pt she forgot to take her bp meds. Educated to watch bp and see pcp -pt agreed.

## 2016-09-30 ENCOUNTER — Ambulatory Visit: Payer: Self-pay | Admitting: Physician Assistant

## 2016-09-30 ENCOUNTER — Encounter: Payer: Self-pay | Admitting: Physician Assistant

## 2016-09-30 VITALS — BP 129/80 | HR 93 | Temp 98.9°F

## 2016-09-30 DIAGNOSIS — J069 Acute upper respiratory infection, unspecified: Secondary | ICD-10-CM

## 2016-09-30 LAB — POCT INFLUENZA A/B
INFLUENZA A, POC: NEGATIVE
INFLUENZA B, POC: NEGATIVE

## 2016-09-30 MED ORDER — PSEUDOEPH-BROMPHEN-DM 30-2-10 MG/5ML PO SYRP: 5.0000 mL | ORAL_SOLUTION | Freq: Four times a day (QID) | ORAL | 0 refills | Status: DC | PRN

## 2016-09-30 MED ORDER — IBUPROFEN 800 MG PO TABS
800.0000 mg | ORAL_TABLET | Freq: Three times a day (TID) | ORAL | 0 refills | Status: DC | PRN
Start: 2016-09-30 — End: 2017-06-28

## 2016-09-30 NOTE — Addendum Note (Signed)
Addended by: Rudene Anda T on: 09/30/2016 04:10 PM   Modules accepted: Orders

## 2016-09-30 NOTE — Progress Notes (Signed)
   Subjective:URI    Patient ID: Michaela Thomas, female    DOB: 08-01-1976, 41 y.o.   MRN: [id number]  HPI Patient c/o nasal congestion, right ear pain, and cough for 2 days. Also c/o frontal headache. Denies N/V/D. Patient has taken Flu shot for this season.   Review of Systems    negative except for compliant. Objective:   Physical Exam Edematous nasal turbinates with clear rhinorrhea. Post nasal drainage. Neck supple without adenopathy. Lungs CTA and Heart RRR.       Assessment & Plan:URI  Bromfed Dm and Ibuprofen. Follow up 3 days if no improvement.

## 2017-03-22 ENCOUNTER — Inpatient Hospital Stay: Payer: Managed Care, Other (non HMO) | Admitting: Oncology

## 2017-03-22 ENCOUNTER — Inpatient Hospital Stay: Payer: Managed Care, Other (non HMO)

## 2017-04-22 ENCOUNTER — Inpatient Hospital Stay: Payer: Managed Care, Other (non HMO)

## 2017-04-22 ENCOUNTER — Inpatient Hospital Stay: Payer: Managed Care, Other (non HMO) | Admitting: Oncology

## 2017-04-22 ENCOUNTER — Telehealth: Payer: Self-pay | Admitting: Oncology

## 2017-04-22 NOTE — Telephone Encounter (Signed)
Lab/MD, 3rd appt to be scheduled. (2 No Shows) L/M on V/M. New appt info mailed. MF

## 2017-05-04 ENCOUNTER — Other Ambulatory Visit: Payer: Managed Care, Other (non HMO)

## 2017-05-04 ENCOUNTER — Ambulatory Visit: Payer: Managed Care, Other (non HMO) | Admitting: Oncology

## 2017-05-17 ENCOUNTER — Inpatient Hospital Stay: Payer: Managed Care, Other (non HMO) | Attending: Oncology

## 2017-05-17 ENCOUNTER — Inpatient Hospital Stay (HOSPITAL_BASED_OUTPATIENT_CLINIC_OR_DEPARTMENT_OTHER): Payer: Managed Care, Other (non HMO) | Admitting: Oncology

## 2017-05-17 VITALS — BP 141/93 | HR 81 | Temp 97.0°F | Resp 18 | Wt 211.0 lb

## 2017-05-17 DIAGNOSIS — Z79899 Other long term (current) drug therapy: Secondary | ICD-10-CM | POA: Insufficient documentation

## 2017-05-17 DIAGNOSIS — Z853 Personal history of malignant neoplasm of breast: Secondary | ICD-10-CM | POA: Insufficient documentation

## 2017-05-17 DIAGNOSIS — R51 Headache: Secondary | ICD-10-CM

## 2017-05-17 DIAGNOSIS — D75839 Thrombocytosis, unspecified: Secondary | ICD-10-CM

## 2017-05-17 DIAGNOSIS — F17210 Nicotine dependence, cigarettes, uncomplicated: Secondary | ICD-10-CM | POA: Insufficient documentation

## 2017-05-17 DIAGNOSIS — I10 Essential (primary) hypertension: Secondary | ICD-10-CM | POA: Diagnosis not present

## 2017-05-17 DIAGNOSIS — D473 Essential (hemorrhagic) thrombocythemia: Secondary | ICD-10-CM

## 2017-05-17 LAB — CBC WITH DIFFERENTIAL/PLATELET
BASOS ABS: 0 10*3/uL (ref 0–0.1)
BASOS PCT: 0 %
EOS ABS: 0.1 10*3/uL (ref 0–0.7)
Eosinophils Relative: 2 %
HCT: 37 % (ref 35.0–47.0)
HEMOGLOBIN: 12.5 g/dL (ref 12.0–16.0)
Lymphocytes Relative: 50 %
Lymphs Abs: 3.3 10*3/uL (ref 1.0–3.6)
MCH: 27.9 pg (ref 26.0–34.0)
MCHC: 33.7 g/dL (ref 32.0–36.0)
MCV: 82.6 fL (ref 80.0–100.0)
MONOS PCT: 6 %
Monocytes Absolute: 0.4 10*3/uL (ref 0.2–0.9)
Neutro Abs: 2.8 10*3/uL (ref 1.4–6.5)
Neutrophils Relative %: 42 %
Platelets: 424 10*3/uL (ref 150–440)
RBC: 4.48 MIL/uL (ref 3.80–5.20)
RDW: 13.9 % (ref 11.5–14.5)
WBC: 6.6 10*3/uL (ref 3.6–11.0)

## 2017-05-17 NOTE — Progress Notes (Signed)
Hematology/Oncology Consult note [facility] Telephone:([phone] [phone]  Patient Care Team: Benito Mccreedy, MD as PCP - General (Internal Medicine)   Name of the patient: Michaela Thomas  [id number]  Sep 10, 1975    Reason for referral- thrombocytosis   Referring physician- Dr. Jeneen Montgomery  Date of visit: 05/17/17   History of presenting illness- Patient was referred to clinic after CBC from 08/31/2016 showed white count of 7.1, H&H of 12.8/39 and a platelet count of 425. One month ago CBC was unremarkable except for a platelet count of 443. Platelet count was 402 in December 2015. HIV test was nonreactive on 09/08/2015. Hepatitis B and hepatitis C testing was negative. Patient denies any h/o prior thromboses. Her mother died of PE in her 79's. Patient used to have daily headaches and nausea which have gotten better. Denies other complaints   ECOG PS- 0  Pain scale- 0   Review of systems- Review of Systems  Constitutional: Negative for chills, fever, malaise/fatigue and weight loss.  HENT: Negative for congestion, ear discharge and nosebleeds.   Eyes: Negative for blurred vision.  Respiratory: Negative for cough, hemoptysis, sputum production, shortness of breath and wheezing.   Cardiovascular: Negative for chest pain, palpitations, orthopnea and claudication.  Gastrointestinal: Negative for abdominal pain, blood in stool, constipation, diarrhea, heartburn, melena and vomiting.  Genitourinary: Negative for dysuria, flank pain, frequency, hematuria and urgency.  Musculoskeletal: Negative for back pain, joint pain and myalgias.  Skin: Negative for rash.  Neurological: Positive for headaches. Negative for dizziness, tingling, focal weakness, seizures and weakness.  Endo/Heme/Allergies: Does not bruise/bleed easily.  Psychiatric/Behavioral: Negative for depression and suicidal ideas. The patient does not have insomnia.     No Known  Allergies  Patient Active Problem List   Diagnosis Date Noted  . Smoker 07/17/2012  . Family history of breast cancer 07/17/2012  . History of abnormal Pap smear 07/17/2012     Past Medical History:  Diagnosis Date  . Hypertension      Past Surgical History:  Procedure Laterality Date  . COLPOSCOPY  2001    Social History   Social History  . Marital status: Single    Spouse name: N/A  . Number of children: N/A  . Years of education: N/A   Occupational History  . Not on file.   Social History Main Topics  . Smoking status: Current Every Day Smoker    Packs/day: 0.50    Years: 5.00    Types: Cigarettes  . Smokeless tobacco: Never Used     Comment: pt given phone number to main desk to ask for smoking cessation class info  . Alcohol use 1.0 oz/week    2 Standard drinks or equivalent per week  . Drug use: No  . Sexual activity: Yes    Birth control/ protection: IUD   Other Topics Concern  . Not on file   Social History Narrative  . No narrative on file     Family History  Problem Relation Age of Onset  . Cancer Mother 38       breast  . Cancer Father        prostate  . Hypertension Father   . Diabetes Father      Current Outpatient Prescriptions:  .  hydrochlorothiazide (HYDRODIURIL) 25 MG tablet, Take 25 mg by mouth daily., Disp: , Rfl:  .  Vitamin D, Ergocalciferol, (DRISDOL) 50000 units CAPS capsule, Take 1 capsule (50,000 Units total) by mouth every 7 (seven) days.,  Disp: 12 capsule, Rfl: 3 .  ibuprofen (ADVIL,MOTRIN) 800 MG tablet, Take 1 tablet (800 mg total) by mouth every 8 (eight) hours as needed for moderate pain. (Patient not taking: Reported on 05/17/2017), Disp: 15 tablet, Rfl: 0 .  ondansetron (ZOFRAN) 4 MG tablet, Take 1 tablet (4 mg total) by mouth every 8 (eight) hours as needed for nausea or vomiting. (Patient not taking: Reported on 09/21/2016), Disp: 20 tablet, Rfl: 0 .  SUMAtriptan (IMITREX) 100 MG tablet, Take one tablet at onset of  headache, may repeat in 2 hours if needed (Patient not taking: Reported on 05/17/2017), Disp: 10 tablet, Rfl: 0   Physical exam:  Vitals:   05/17/17 1204  BP: (!) 141/93  Pulse: 81  Resp: 18  Temp: (!) 97 F (36.1 C)  TempSrc: Tympanic  Weight: 211 lb (95.7 kg)   Physical Exam  Constitutional: She is oriented to person, place, and time and well-developed, well-nourished, and in no distress.  HENT:  Head: Normocephalic and atraumatic.  Eyes: Pupils are equal, round, and reactive to light. EOM are normal.  Neck: Normal range of motion.  Cardiovascular: Normal rate, regular rhythm and normal heart sounds.   Pulmonary/Chest: Effort normal and breath sounds normal.  Abdominal: Soft. Bowel sounds are normal.  Neurological: She is alert and oriented to person, place, and time.  Skin: Skin is warm and dry.       CMP Latest Ref Rng & Units 08/13/2016  Glucose 65 - 99 mg/dL 104(H)  BUN 6 - 20 mg/dL 13  Creatinine 0.57 - 1.00 mg/dL 0.74  Sodium 134 - 144 mmol/L 139  Potassium 3.5 - 5.2 mmol/L 3.9  Chloride 96 - 106 mmol/L 100  Calcium 8.7 - 10.2 mg/dL 9.5  Total Protein 6.0 - 8.5 g/dL 7.4  Total Bilirubin 0.0 - 1.2 mg/dL <0.2  Alkaline Phos 39 - 117 IU/L 65  AST 0 - 40 IU/L 28  ALT 0 - 32 IU/L 37(H)   CBC Latest Ref Rng & Units 05/17/2017  WBC 3.6 - 11.0 K/uL 6.6  Hemoglobin 12.0 - 16.0 g/dL 12.5  Hematocrit 35.0 - 47.0 % 37.0  Platelets 150 - 440 K/uL 424    Assessment and plan- Patient is a 41 y.o. female who has been referred to [place] for evaluation of thrombocytosis  1. 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 Peripheral smear review showed unremarkable rbcs, myeloid series and platelets. Iron studies are normal. ESR and CRP normal. Given platelet counts <450, this can be monitored by PCP DR. Koirala Q6 months for a couple of years. If there is no consistent increase increase in platelet count, she does not  require any work up. She can be referred to [place] back If her platelet counts show a rising trend  Visit Diagnosis 1. Thrombocytosis ([place])     Porter Moes G. Zenia Resides, NP 05/17/17 12:37 PM  Dr. Randa Evens, MD, MPH [place] at [facility] Pager- [phone] 05/17/2017 12:19 PM

## 2017-05-17 NOTE — Progress Notes (Signed)
Here for follow up

## 2017-05-24 ENCOUNTER — Telehealth: Payer: Self-pay | Admitting: *Deleted

## 2017-05-24 NOTE — Telephone Encounter (Signed)
I called the PCP office and let them know that pt does not need to come any longer. The plt are below 450.  The note from Dr. Janese Banks has been faxed to the PCP office to make them aware and pt already knew that she was being let go from this office and if her plt. Go higher she can always come back

## 2017-05-24 NOTE — Progress Notes (Signed)
I have personally performed a a face to face evaluation of this patient and I agree with the history and physical and assessment as documented by NP lauren Zenia Resides  Thrombocytosis- her platelet counts have remained <450. No further work up required at this point. Dr. Dorthy Cooler will be her new pcp and he can monitor her platelet counts Q6 months- 1 year. If there is a consistent increase in her platelet count or if she has any thromboembolic episodes, she can be referred to [place]   Dr. Randa Evens, MD, MPH [facility] at [facility] Pager- [phone] 05/24/2017 8:18 AM

## 2017-06-28 ENCOUNTER — Ambulatory Visit: Payer: Self-pay | Admitting: Physician Assistant

## 2017-06-28 ENCOUNTER — Encounter: Payer: Self-pay | Admitting: Physician Assistant

## 2017-06-28 VITALS — BP 140/80 | HR 92 | Temp 98.5°F | Resp 16

## 2017-06-28 DIAGNOSIS — J069 Acute upper respiratory infection, unspecified: Secondary | ICD-10-CM

## 2017-06-28 MED ORDER — BENZONATATE 200 MG PO CAPS: 200.0000 mg | ORAL_CAPSULE | Freq: Three times a day (TID) | ORAL | 0 refills | Status: DC | PRN

## 2017-06-28 MED ORDER — PREDNISONE 10 MG PO TABS: 30.0000 mg | ORAL_TABLET | Freq: Every day | ORAL | 0 refills | Status: DC

## 2017-06-28 NOTE — Progress Notes (Signed)
S: C/o dry cough for 1 day, voice is hoarse, right ear pain, a little sinus congestion,, no fever, chills, cp/sob, v/d;    Using otc meds:   O: PE: vitals wnl, perrl eomi, normocephalic, tms dull, nasal mucosa red and swollen, throat injected, neck supple no lymph, lungs c t a, cv rrr, neuro intact  A:  Acute viral uri   P: drink fluids, continue regular meds , use otc meds of choice, return if not improving in 5 days, return earlier if worsening , prescription for Tessalon Perles 200 mg 13 times a day as needed for cough, prednisone 30 mg per day for 3 days, if she is worsening she is to call the clinic and we will call in an antibiotic

## 2017-06-30 ENCOUNTER — Telehealth: Payer: Self-pay | Admitting: Physician Assistant

## 2017-06-30 MED ORDER — FLUCONAZOLE 150 MG PO TABS: ORAL_TABLET | ORAL | 0 refills | Status: DC

## 2017-06-30 MED ORDER — AMOXICILLIN 875 MG PO TABS: 875.0000 mg | ORAL_TABLET | Freq: Two times a day (BID) | ORAL | 0 refills | Status: DC

## 2017-06-30 NOTE — Telephone Encounter (Signed)
Patient call the office and said she has not gotten any better from her cold symptoms. She states she is feeling worse. Question if she can get an antibiotic at this time as she finished the prednisone and the [employer] Sent a prescription for amoxicillin 875 twice a day and Diflucan Patient was told to come to the clinic if she feels like she is worsening

## 2017-11-22 ENCOUNTER — Other Ambulatory Visit: Payer: Self-pay | Admitting: Family Medicine

## 2017-11-22 DIAGNOSIS — Z1231 Encounter for screening mammogram for malignant neoplasm of breast: Secondary | ICD-10-CM

## 2017-11-24 ENCOUNTER — Telehealth: Payer: Self-pay

## 2017-11-24 NOTE — Telephone Encounter (Signed)
Left voicemail to return call. Tried cell and home number.

## 2017-11-24 NOTE — Telephone Encounter (Signed)
Received refill request  Vitamin D 50,000 unit cap Dispensed- 08/17/16 #12 3 refills  Last O/V- (Acute URI) 06/28/17 Last Vit D Lab- 08/13/16

## 2017-11-24 NOTE — Telephone Encounter (Signed)
Patient needs to see her primary care provider for this.  If she does not currently have a primary care provider, she needs to call make an appointment today to establish care with new primary care provider for long-term management of her chronic medical problems. In the meantime (if she does not have a PCP), I am happy to have her come in and get her vitamin D level checked (lab visit only) and schedule an office visit with me a few days later when the lab result is back to discuss if a refill would be appropriate.

## 2017-12-14 ENCOUNTER — Ambulatory Visit: Payer: Self-pay | Admitting: Family Medicine

## 2017-12-14 VITALS — BP 137/96 | HR 87 | Temp 98.5°F | Resp 20

## 2017-12-14 DIAGNOSIS — R059 Cough, unspecified: Secondary | ICD-10-CM

## 2017-12-14 DIAGNOSIS — R05 Cough: Secondary | ICD-10-CM

## 2017-12-14 DIAGNOSIS — J4520 Mild intermittent asthma, uncomplicated: Secondary | ICD-10-CM

## 2017-12-14 MED ORDER — BENZONATATE 200 MG PO CAPS: 200.0000 mg | ORAL_CAPSULE | Freq: Every evening | ORAL | 0 refills | Status: DC | PRN

## 2017-12-14 MED ORDER — ALBUTEROL SULFATE HFA 108 (90 BASE) MCG/ACT IN AERS: 2.0000 | INHALATION_SPRAY | Freq: Four times a day (QID) | RESPIRATORY_TRACT | 0 refills | Status: AC | PRN

## 2017-12-14 MED ORDER — PREDNISONE 20 MG PO TABS: 40.0000 mg | ORAL_TABLET | Freq: Every day | ORAL | 0 refills | Status: AC
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
## 2017-12-15 ENCOUNTER — Ambulatory Visit
Admission: RE | Admit: 2017-12-15 | Discharge: 2017-12-15 | Disposition: A | Discharge status: Home / Self Care | Payer: Managed Care, Other (non HMO) | Source: Ambulatory Visit | Attending: Family Medicine | Admitting: Family Medicine

## 2017-12-15 DIAGNOSIS — Z1231 Encounter for screening mammogram for malignant neoplasm of breast: Secondary | ICD-10-CM

## 2018-02-02 ENCOUNTER — Ambulatory Visit: Payer: Self-pay | Admitting: Family Medicine

## 2018-02-02 VITALS — BP 127/90 | HR 70 | Resp 16 | Ht 68.0 in | Wt 214.0 lb

## 2018-02-02 DIAGNOSIS — Z0189 Encounter for other specified special examinations: Principal | ICD-10-CM

## 2018-02-02 DIAGNOSIS — Z008 Encounter for other general examination: Secondary | ICD-10-CM

## 2018-02-02 NOTE — Progress Notes (Signed)
Subjective: Annual biometrics screening  Patient presents for her annual biometric screening. Patient reports eating a healthy, well-rounded diet and getting regular exercise.  Patient regularly sees her primary care provider. Patient works for [employer] Patient denies any other issues or concerns.   Review of Systems Unremarkable  Objective  Physical Exam General: Awake, alert and oriented. No acute distress. Well developed, hydrated and nourished. Appears stated age.  HEENT: Supple neck without adenopathy. Sclera is non-icteric. The ear canal is clear without discharge. The tympanic membrane is normal in appearance with normal landmarks and cone of light. Nasal mucosa is pink and moist. Oral mucosa is pink and moist. The pharynx is normal in appearance without tonsillar swelling or exudates.  Skin: Skin in warm, dry and intact without rashes or lesions. Appropriate color for ethnicity. Cardiac: Heart rate and rhythm are normal. No murmurs, gallops, or rubs are auscultated.  Respiratory: The chest wall is symmetric and without deformity. No signs of respiratory distress. Lung sounds are clear in all lobes bilaterally without rales, ronchi, or wheezes.  Neurological: The patient is awake, alert and oriented to person, place, and time with normal speech.  Memory is normal and thought processes intact. No gait abnormalities are appreciated.  Psychiatric: Appropriate mood and affect.   Assessment Annual biometrics screening  Plan  Lipid panel and fasting blood sugar pending. Encouraged routine visits with primary care provider.  Encouraged patient to get regular exercise and eat a healthy, well-rounded diet.

## 2018-02-03 LAB — LIPID PANEL
CHOLESTEROL TOTAL: 203 mg/dL — AB (ref 100–199)
Chol/HDL Ratio: 5.1 ratio — ABNORMAL HIGH (ref 0.0–4.4)
HDL: 40 mg/dL (ref >39)
LDL CALC: 148 mg/dL — AB (ref 0–99)
TRIGLYCERIDES: 77 mg/dL (ref 0–149)
VLDL Cholesterol Cal: 15 mg/dL (ref 5–40)

## 2018-02-03 LAB — GLUCOSE, RANDOM: Glucose: 101 mg/dL — ABNORMAL HIGH (ref 65–99)

## 2018-02-03 NOTE — Progress Notes (Signed)
Dear Michaela Thomas, I wanted to let you know that your lipid panel and fasting blood sugar came back.  Everything is normal, with the exception of your total cholesterol, LDL cholesterol, cholesterol/HDL ratio, and fasting blood sugar.  Your total cholesterol is elevated at 203, normal values are between 100 and 199. Your LDL cholesterol ("bad cholesterol") is elevated at 148, normal values are below 99.  Your cholesterol/HDL ratio is elevated at 5.1, normal values are between 0 and 4.4 for women or 0 and 5 from men.  Your fasting blood sugar is elevated at 101, normal values are between 65 and 99.  This may represent "prediabetes" but further evaluation is necessary.  These abnormal values increase your risk for cardiovascular disease.  I want you to follow-up with your primary care provider regarding these results. It was lovely seeing you yesterday, keep up the good work with your diet and exercise!

## 2018-11-01 ENCOUNTER — Other Ambulatory Visit: Payer: Self-pay | Admitting: Family Medicine

## 2018-11-01 DIAGNOSIS — Z1231 Encounter for screening mammogram for malignant neoplasm of breast: Secondary | ICD-10-CM

## 2018-12-18 ENCOUNTER — Ambulatory Visit: Payer: Self-pay

## 2019-01-24 ENCOUNTER — Encounter: Payer: Self-pay | Admitting: Physical Therapy

## 2019-01-24 ENCOUNTER — Ambulatory Visit: Payer: Managed Care, Other (non HMO) | Admitting: Physical Therapy

## 2019-01-24 ENCOUNTER — Other Ambulatory Visit: Payer: Self-pay

## 2019-01-24 DIAGNOSIS — G8929 Other chronic pain: Secondary | ICD-10-CM | POA: Insufficient documentation

## 2019-01-24 DIAGNOSIS — M6281 Muscle weakness (generalized): Secondary | ICD-10-CM | POA: Insufficient documentation

## 2019-01-24 DIAGNOSIS — M62830 Muscle spasm of back: Secondary | ICD-10-CM

## 2019-01-24 DIAGNOSIS — M545 Low back pain: Secondary | ICD-10-CM | POA: Insufficient documentation

## 2019-01-24 DIAGNOSIS — E119 Type 2 diabetes mellitus without complications: Secondary | ICD-10-CM | POA: Diagnosis not present
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
## 2019-01-30 ENCOUNTER — Ambulatory Visit: Payer: Managed Care, Other (non HMO) | Admitting: Physical Therapy

## 2019-01-30 ENCOUNTER — Encounter: Payer: Self-pay | Admitting: Physical Therapy

## 2019-01-30 ENCOUNTER — Other Ambulatory Visit: Payer: Self-pay

## 2019-01-30 DIAGNOSIS — M62830 Muscle spasm of back: Secondary | ICD-10-CM

## 2019-01-30 DIAGNOSIS — E119 Type 2 diabetes mellitus without complications: Secondary | ICD-10-CM | POA: Diagnosis not present

## 2019-01-30 DIAGNOSIS — G8929 Other chronic pain: Secondary | ICD-10-CM

## 2019-01-30 DIAGNOSIS — M6281 Muscle weakness (generalized): Secondary | ICD-10-CM

## 2019-01-30 NOTE — Patient Instructions (Signed)
Access Code: [email or web address]  URL: [email or web address]  Date: 01/30/2019  Prepared by: Ruben Im   Exercises  Supine Double Knee to Chest - 3 reps - 1 sets - 30 sec hold - 1x daily - 7x weekly  Hip Flexor Stretch at Edge of Bed - 3 reps - 1 sets - 30 sec hold - 1x daily - 7x weekly  Supine Piriformis Stretch - 3 reps - 1 sets - 30 sec hold - 1x daily - 7x weekly  Hooklying Transversus Abdominis Palpation - 10 reps - 1 sets - 5 hold - 5x daily - 7x weekly  Hooklying Isometric Hip Flexion - 10 reps - 1 sets - 1x daily - 7x weekly  Supine 90/90 Shoulder Flexion with Abdominal Bracing - 10 reps - 1 sets - 1x daily - 7x weekly  Clamshell - 10 reps - 3 sets - 1x daily - 7x weekly  Bird Dog - 10 reps - 1 sets - 1x daily - 7x weekly  Seated Transversus Abdominis Bracing - 10 reps - 1 sets - 1x daily - 7x weekly  Standing Transverse Abdominis Contraction - 10 reps - 1 sets - 1x daily - 7x weekly
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
## 2019-02-06 ENCOUNTER — Other Ambulatory Visit: Payer: Self-pay

## 2019-02-06 ENCOUNTER — Encounter: Payer: Self-pay | Admitting: Dietician

## 2019-02-06 ENCOUNTER — Ambulatory Visit: Payer: Managed Care, Other (non HMO) | Admitting: Physical Therapy

## 2019-02-06 ENCOUNTER — Encounter: Payer: Self-pay | Admitting: Physical Therapy

## 2019-02-06 ENCOUNTER — Encounter: Payer: Managed Care, Other (non HMO) | Attending: Family Medicine | Admitting: Dietician

## 2019-02-06 DIAGNOSIS — G8929 Other chronic pain: Secondary | ICD-10-CM

## 2019-02-06 DIAGNOSIS — E119 Type 2 diabetes mellitus without complications: Secondary | ICD-10-CM | POA: Diagnosis not present

## 2019-02-06 DIAGNOSIS — M6281 Muscle weakness (generalized): Secondary | ICD-10-CM

## 2019-02-06 DIAGNOSIS — M62830 Muscle spasm of back: Secondary | ICD-10-CM

## 2019-02-06 NOTE — Patient Instructions (Addendum)
PLAN: Recommend 2000 units of vitamin D daily. Continue to work at stopping smoking.  Self care Aim to be active most days. Drink beverages without carbohydrates most often.  Aim for 2-3 Carb Choices per meal (30-45 grams) +/- 1 either way  Aim for 0-1 Carbs per snack if hungry  Include protein in moderation with your meals and snacks Consider reading food labels for Total Carbohydrate of foods Consider  increasing your activity level by walking or PT exercises or other  for 30-45 minutes daily as tolerated

## 2019-02-06 NOTE — Progress Notes (Signed)
Diabetes Self-Management Education  Visit Type: First/Initial  Appt. Start Time: 0945 Appt. End Time: 1100  02/08/2019  [name], identified by name and date of birth, is a 43 y.o. female with a diagnosis of Diabetes: Type 2.   ASSESSMENT History includes prediabetes and last A1C 03/16/18 was 6.5% which meets criteria for type 2 diabetes.  HTN, vitamin D deficiency (13.7 in 2017), thrombocytosis, chronic low back pain and is currently in PT, and smokes but has plans to quit and has a quit date.  She also is having increased constipation.  Milk of magnesia works well for this.  She is trying rexol fiber and has started taking this 2-3 times per day.  Her diet is low in fiber.   Sleeps very well but only 6-7 hours per night currently as she is working from home.  Only about 4-5 hours per night when she was commuting due to a long commute and long work hours.  Other labs include cholesterol 203, HDL 40, LDL 148, Triglycerides 77 01/2018.  Never weighed this much in her life even when pregnant. Never has been a big sweet eater Patient lives with her boyfriend.  She does the shopping and cooking.  She works from [employer] and is working from home most often currently due to [employer].  She has worked in [place] last year. She is in a high stress job but states she feels this is her calling and it does not bother her.  The main issue is not getting enough sleep when she is commuting.  Height 5\' 4"  (1.626 m), weight 223 lb (101.2 kg). Body mass index is 38.28 kg/m.  Diabetes Self-Management Education - 02/06/19 1006      Visit Information   Visit Type  First/Initial      Initial Visit   Diabetes Type  Type 2    Are you currently following a meal plan?  No    Are you taking your medications as prescribed?  Not on Medications    Date Diagnosed  2019      Health Coping   How would you rate your overall health?  Good      Psychosocial Assessment   Patient Belief/Attitude about  Diabetes  Afraid    Self-care barriers  None    Self-management support  Doctor's office;Family    Other persons present  Patient    Patient Concerns  Nutrition/Meal planning;Weight Control    Special Needs  None    Preferred Learning Style  No preference indicated    Learning Readiness  Ready    How often do you need to have someone help you when you read instructions, pamphlets, or other written materials from your doctor or pharmacy?  1 - Never    What is the last grade level you completed in school?  4 years college      Pre-Education Assessment   Patient understands the diabetes disease and treatment process.  Needs Instruction    Patient understands incorporating nutritional management into lifestyle.  Needs Instruction    Patient undertands incorporating physical activity into lifestyle.  Needs Instruction    Patient understands using medications safely.  Needs Instruction    Patient understands monitoring blood glucose, interpreting and using results  Needs Instruction    Patient understands prevention, detection, and treatment of acute complications.  Needs Instruction    Patient understands prevention, detection, and treatment of chronic complications.  Needs Instruction    Patient understands how to develop  strategies to address psychosocial issues.  Needs Instruction    Patient understands how to develop strategies to promote health/change behavior.  Needs Instruction      Complications   Last HgB A1C per patient/outside source  6.5 %   02/2018   How often do you check your blood sugar?  0 times/day (not testing)    Have you had a dilated eye exam in the past 12 months?  Yes    Have you had a dental exam in the past 12 months?  Yes    Are you checking your feet?  No      Dietary Intake   Breakfast  skips often OR eggs, bacon or sausage, (avoiding biscuits)    Snack (morning)  none    Lunch  burger, fries especially when she is working out OR pasta with meat sauce (ground  [place]), salad OR other leftovers from dinner    Snack (afternoon)  none    Dinner  chicken (baked) or salmon or [place], rice, vegetables and occasional ice cream    Snack (evening)  none    Beverage(s)  water, juice, rare coffee with cream and sugar, 1-2 regular soda per week      Exercise   Exercise Type  Light (walking / raking leaves)   PT- used to enjoy running   How many days per week to you exercise?  2    How many minutes per day do you exercise?  45    Total minutes per week of exercise  90      Patient Education   Disease state   Definition of diabetes, type 1 and 2, and the diagnosis of diabetes;Factors that contribute to the development of diabetes;Explored patient's options for treatment of their diabetes    Nutrition management   Role of diet in the treatment of diabetes and the relationship between the three main macronutrients and blood glucose level;Food label reading, portion sizes and measuring food.;Carbohydrate counting;Information on hints to eating out and maintain blood glucose control.;Meal options for control of blood glucose level and chronic complications.    Physical activity and exercise   Role of exercise on diabetes management, blood pressure control and cardiac health.;Helped patient identify appropriate exercises in relation to his/her diabetes, diabetes complications and other health issue.    Monitoring  Yearly dilated eye exam;Daily foot exams;Identified appropriate SMBG and/or A1C goals.;Purpose and frequency of SMBG.    Chronic complications  Relationship between chronic complications and blood glucose control;Dental care;Retinopathy and reason for yearly dilated eye exams    Psychosocial adjustment  Worked with patient to identify barriers to care and solutions;Role of stress on diabetes;Identified and addressed patients feelings and concerns about diabetes    Personal strategies to promote health  Lifestyle issues that need to be addressed for better  diabetes care;Review risk of smoking and offered smoking cessation      Individualized Goals (developed by patient)   Nutrition  General guidelines for healthy choices and portions discussed    Physical Activity  Exercise 5-7 days per week;30 minutes per day    Medications  Not Applicable    Monitoring   Not Applicable;Other (comment)    Problem Solving  adequate sleep    Reducing Risk  stop smoking;do foot checks daily;increase portions of healthy fats      Post-Education Assessment   Patient understands the diabetes disease and treatment process.  Demonstrates understanding / competency    Patient understands incorporating nutritional management into lifestyle.  Demonstrates understanding / competency    Patient undertands incorporating physical activity into lifestyle.  Demonstrates understanding / competency    Patient understands using medications safely.  Demonstrates understanding / competency    Patient understands monitoring blood glucose, interpreting and using results  Demonstrates understanding / competency    Patient understands prevention, detection, and treatment of acute complications.  Demonstrates understanding / competency    Patient understands prevention, detection, and treatment of chronic complications.  Demonstrates understanding / competency    Patient understands how to develop strategies to address psychosocial issues.  Demonstrates understanding / competency    Patient understands how to develop strategies to promote health/change behavior.  Demonstrates understanding / competency      Outcomes   Expected Outcomes  Demonstrated interest in learning. Expect positive outcomes    Future DMSE  3-4 months    Program Status  Completed       Individualized Plan for Diabetes Self-Management Training:   Learning Objective:  Patient will have a greater understanding of diabetes self-management. Patient education plan is to attend individual and/or group sessions per  assessed needs and concerns.   Plan:   Patient Instructions  PLAN: Recommend 2000 units of vitamin D daily. Continue to work at stopping smoking.  Self care Aim to be active most days. Drink beverages without carbohydrates most often.  Aim for 2-3 Carb Choices per meal (30-45 grams) +/- 1 either way  Aim for 0-1 Carbs per snack if hungry  Include protein in moderation with your meals and snacks Consider reading food labels for Total Carbohydrate of foods Consider  increasing your activity level by walking or PT exercises or other  for 30-45 minutes daily as tolerated       Expected Outcomes:  Demonstrated interest in learning. Expect positive outcomes  Education material provided: ADA - How to Thrive: A Guide for Your Journey with Diabetes, Meal plan card, My Plate and Snack sheet , breakfast ideas, eating out tips, high fiber nutrition therapy If problems or questions, patient to contact team via:  Phone and Email  Future DSME appointment: 3-4 months
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
## 2019-02-06 NOTE — Patient Instructions (Signed)
Access Code: [email or web address]  URL: [email or web address]  Date: 02/06/2019  Prepared by: Ruben Im   Exercises  Supine Double Knee to Chest - 3 reps - 1 sets - 30 sec hold - 1x daily - 7x weekly  Hip Flexor Stretch at Edge of Bed - 3 reps - 1 sets - 30 sec hold - 1x daily - 7x weekly  Supine Piriformis Stretch - 3 reps - 1 sets - 30 sec hold - 1x daily - 7x weekly  Hooklying Transversus Abdominis Palpation - 10 reps - 1 sets - 5 hold - 5x daily - 7x weekly  Hooklying Isometric Hip Flexion - 10 reps - 1 sets - 1x daily - 7x weekly  Supine 90/90 Shoulder Flexion with Abdominal Bracing - 10 reps - 1 sets - 1x daily - 7x weekly  Clamshell - 10 reps - 3 sets - 1x daily - 7x weekly  Bird Dog - 10 reps - 1 sets - 1x daily - 7x weekly  Seated Transversus Abdominis Bracing - 10 reps - 1 sets - 1x daily - 7x weekly  Standing Transverse Abdominis Contraction - 10 reps - 1 sets - 1x daily - 7x weekly  Standing Shoulder Row with Anchored Resistance - 10 reps - 3 sets - 1x daily - 7x weekly  Shoulder extension with resistance - Neutral - 10 reps - 3 sets - 1x daily - 7x weekly  Sit to Stand - 10 reps - 1 sets - 1x daily - 7x weekly    Ruben Im PT

## 2019-02-13 ENCOUNTER — Ambulatory Visit: Payer: Managed Care, Other (non HMO) | Admitting: Physical Therapy

## 2019-02-20 ENCOUNTER — Encounter: Payer: Managed Care, Other (non HMO) | Admitting: Physical Therapy

## 2019-02-21 ENCOUNTER — Ambulatory Visit: Payer: Managed Care, Other (non HMO) | Attending: Family Medicine | Admitting: Physical Therapy

## 2019-02-21 ENCOUNTER — Other Ambulatory Visit: Payer: Self-pay

## 2019-02-21 ENCOUNTER — Encounter: Payer: Self-pay | Admitting: Physical Therapy

## 2019-02-21 DIAGNOSIS — M545 Low back pain, unspecified: Secondary | ICD-10-CM

## 2019-02-21 DIAGNOSIS — G8929 Other chronic pain: Secondary | ICD-10-CM | POA: Diagnosis present

## 2019-02-21 DIAGNOSIS — M6281 Muscle weakness (generalized): Secondary | ICD-10-CM

## 2019-02-21 DIAGNOSIS — M62830 Muscle spasm of back: Secondary | ICD-10-CM
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
## 2019-02-21 NOTE — Patient Instructions (Signed)
    Access Code: [email or web address]  URL: [email or web address]  Date: 02/21/2019  Prepared by: Ruben Im   Exercises  Supine Double Knee to Chest - 3 reps - 1 sets - 30 sec hold - 1x daily - 7x weekly  Hip Flexor Stretch at Edge of Bed - 3 reps - 1 sets - 30 sec hold - 1x daily - 7x weekly  Supine Piriformis Stretch - 3 reps - 1 sets - 30 sec hold - 1x daily - 7x weekly  Hooklying Transversus Abdominis Palpation - 10 reps - 1 sets - 5 hold - 5x daily - 7x weekly  Hooklying Isometric Hip Flexion - 10 reps - 1 sets - 1x daily - 7x weekly  Supine 90/90 Shoulder Flexion with Abdominal Bracing - 10 reps - 1 sets - 1x daily - 7x weekly  Clamshell - 10 reps - 3 sets - 1x daily - 7x weekly  Bird Dog - 10 reps - 1 sets - 1x daily - 7x weekly  Seated Transversus Abdominis Bracing - 10 reps - 1 sets - 1x daily - 7x weekly  Standing Transverse Abdominis Contraction - 10 reps - 1 sets - 1x daily - 7x weekly  Standing Shoulder Row with Anchored Resistance - 10 reps - 3 sets - 1x daily - 7x weekly  Shoulder extension with resistance - Neutral - 10 reps - 3 sets - 1x daily - 7x weekly  Sit to Stand - 10 reps - 1 sets - 1x daily - 7x weekly  Superman on Table - 10 reps - 1 sets - 1x daily - 7x weekly  Side Stepping with Resistance at Thighs - 10 reps - 1 sets - 1x daily - 7x weekly    Prone pelvic press  10x right and left each Prone pelvic press with knee flex  Right and left 10 x each and then bilaterally x 10 Prone pelvic press with hip extension right and left 10 x each Prone pelvic press with knee flex and hip ext Right and left 10 times each Upper body sequence  Start with pelvic press  Do 10 reps with arms in  T, W M Y shape keep neck in neutral position Lift head and arms up . Then lift upper body.

## 2019-02-22 ENCOUNTER — Encounter

## 2019-03-01 ENCOUNTER — Ambulatory Visit: Payer: Managed Care, Other (non HMO) | Admitting: Physical Therapy

## 2019-03-02 ENCOUNTER — Ambulatory Visit: Payer: Managed Care, Other (non HMO) | Admitting: Physical Therapy

## 2019-03-02 ENCOUNTER — Other Ambulatory Visit: Payer: Self-pay

## 2019-03-02 ENCOUNTER — Encounter: Payer: Self-pay | Admitting: Physical Therapy

## 2019-03-02 DIAGNOSIS — G8929 Other chronic pain: Secondary | ICD-10-CM

## 2019-03-02 DIAGNOSIS — M6281 Muscle weakness (generalized): Secondary | ICD-10-CM

## 2019-03-02 DIAGNOSIS — M545 Low back pain, unspecified: Secondary | ICD-10-CM

## 2019-03-02 DIAGNOSIS — M62830 Muscle spasm of back: Secondary | ICD-10-CM

## 2019-03-02 NOTE — Therapy (Signed)
[facility] [address] [place], [place], [address] Phone: [phone]   Fax:  [phone]  Physical Therapy Treatment  Patient Details  Name: Michaela Thomas MRN: [id number] Date of Birth: June 01, 1976 Referring Provider (PT): Lujean Amel, MD   Encounter Date: 03/02/2019  PT End of Session - 03/02/19 0732    Visit Number  5    Date for PT Re-Evaluation  03/21/19    PT Start Time  0732    PT Stop Time  0814    PT Time Calculation (min)  42 min    Activity Tolerance  Patient tolerated treatment well    Behavior During Therapy  [facility] for tasks assessed/performed       Past Medical History:  Diagnosis Date  . Diabetes mellitus without complication ([place])   . Hyperlipidemia   . Hypertension     Past Surgical History:  Procedure Laterality Date  . COLPOSCOPY  2001    There were no vitals filed for this visit.  Subjective Assessment - 03/02/19 0737    Subjective  My lower back got tight at about 12.5 minutes and it used to be about 10 minutes.  I notice I can move around a little bit easier in the kitchen    Patient Stated Goals  walking and exercise    Currently in Pain?  No/denies                       [facility] Adult PT Treatment/Exercise - 03/02/19 0001      Lumbar Exercises: Stretches   Figure 4 Stretch  2 reps;30 seconds;Seated;With overpressure    Other Lumbar Stretch Exercise  supine on foam roller - single knee to chest with one leg straight to stretch hip flexor; double knee to chest with rock side to side - 2 min each      Lumbar Exercises: Aerobic   Nustep  6 min L2    PT present to get status update     Lumbar Exercises: Standing   Other Standing Lumbar Exercises  monster walk fwd/back; side stepping - red band - front gym area 2 laps each    Other Standing Lumbar Exercises  step down 2x10 each side - cues to keep pelvis level and sit back - 6 inch onto foam mat      Lumbar  Exercises: Supine   Dead Bug  20 reps   LE only   Large Ball Oblique Isometric Limitations  rolling ball in and out with LE; overhead with UE    Other Supine Lumbar Exercises  bent knee drop out - TrA engaged -                PT Short Term Goals - 02/21/19 1136      PT SHORT TERM GOAL #1   Title  ind with initial HEP    Status  Achieved        PT Long Term Goals - 03/02/19 4166      PT LONG TERM GOAL #1   Title  pt will be ind with advanced HEP in order to work towards returning to regular workout routine    Status  On-going      PT LONG TERM GOAL #2   Title  Pt will be able to walk 20 minutes x 5 days/week without increased back pain    Baseline  walking 3-4 days/week    Status  On-going  PT LONG TERM GOAL #3   Title  Pt will report 80% less pain overall    Status  On-going      PT LONG TERM GOAL #4   Title  Pt will be able to stand for at least 30 minutes to prep meals without back pain    Baseline  12.5 min    Status  On-going      PT LONG TERM GOAL #5   Title  FOTO is 36% limited or less    Status  On-going            Plan - 03/02/19 2119    Clinical Impression Statement  Patient did well with no increased pain during exercises.  She is still able to demonstrate progressing exercises to more difficult standing exercises.  Pt did a nice job with step down only mild hip drop and improved with verbal cues.  She is still having difficulty being up for long periods of time, but is getting better.  Pt will benefit from skilled PT to continue with POC for acheiving functional goals.    PT Treatment/Interventions  ADLs/Self Care Home Management;Biofeedback;Cryotherapy;Electrical Stimulation;Moist Heat;Traction;Therapeutic activities;Therapeutic exercise;Neuromuscular re-education;Patient/family education;Manual techniques;Dry needling;Taping;Passive range of motion    PT Next Visit Plan  review prone multifidi press;  progression of standing with abdominal  brace;  try hip hinge/dead lifting;  review HEP as needed;  aerobic ex    PT Home Exercise Plan   Access Code: [email or web address]     Recommended Other Services  cert signed    Consulted and Agree with Plan of Care  Patient       Patient will benefit from skilled therapeutic intervention in order to improve the following deficits and impairments:  Increased muscle spasms, Decreased range of motion, Increased fascial restricitons, Decreased strength, Pain, Postural dysfunction, Impaired flexibility  Visit Diagnosis: 1. Chronic bilateral low back pain without sciatica   2. Muscle spasm of back   3. Muscle weakness (generalized)        Problem List Patient Active Problem List   Diagnosis Date Noted  . Smoker 07/17/2012  . Family history of breast cancer 07/17/2012  . History of abnormal Pap smear 07/17/2012    Jule Ser, PT 03/02/2019, 8:28 AM  [facility] [address] [place], [place], [address] Phone: [phone]   Fax:  [phone]  Name: Michaela Thomas MRN: [id number] Date of Birth: 03-13-1976

## 2019-03-05 ENCOUNTER — Ambulatory Visit: Payer: Managed Care, Other (non HMO) | Admitting: Physical Therapy

## 2019-03-05 ENCOUNTER — Telehealth: Payer: Self-pay | Admitting: Physical Therapy

## 2019-03-05 NOTE — Telephone Encounter (Signed)
Left message for patient to call.  Patient no-showed for visit.

## 2019-03-06 ENCOUNTER — Encounter: Payer: Managed Care, Other (non HMO) | Admitting: Physical Therapy

## 2019-03-07 ENCOUNTER — Other Ambulatory Visit: Payer: Self-pay

## 2019-03-07 ENCOUNTER — Ambulatory Visit
Admission: RE | Admit: 2019-03-07 | Discharge: 2019-03-07 | Disposition: A | Discharge status: Home / Self Care | Payer: Managed Care, Other (non HMO) | Source: Ambulatory Visit | Attending: Family Medicine | Admitting: Family Medicine

## 2019-03-07 DIAGNOSIS — Z1231 Encounter for screening mammogram for malignant neoplasm of breast: Secondary | ICD-10-CM

## 2019-03-08 ENCOUNTER — Other Ambulatory Visit: Payer: Self-pay | Admitting: Family Medicine

## 2019-03-08 DIAGNOSIS — R928 Other abnormal and inconclusive findings on diagnostic imaging of breast: Secondary | ICD-10-CM

## 2019-03-09 ENCOUNTER — Other Ambulatory Visit: Payer: Self-pay

## 2019-03-09 ENCOUNTER — Ambulatory Visit
Admission: RE | Admit: 2019-03-09 | Discharge: 2019-03-09 | Disposition: A | Discharge status: Home / Self Care | Payer: Managed Care, Other (non HMO) | Source: Ambulatory Visit | Attending: Family Medicine | Admitting: Family Medicine

## 2019-03-09 ENCOUNTER — Ambulatory Visit: Payer: Managed Care, Other (non HMO)

## 2019-03-09 DIAGNOSIS — R928 Other abnormal and inconclusive findings on diagnostic imaging of breast: Secondary | ICD-10-CM

## 2019-03-12 ENCOUNTER — Other Ambulatory Visit: Payer: Self-pay

## 2019-03-12 ENCOUNTER — Ambulatory Visit: Payer: Managed Care, Other (non HMO) | Admitting: Physical Therapy

## 2019-03-12 ENCOUNTER — Encounter: Payer: Self-pay | Admitting: Physical Therapy

## 2019-03-12 DIAGNOSIS — G8929 Other chronic pain: Secondary | ICD-10-CM

## 2019-03-12 DIAGNOSIS — M545 Low back pain: Secondary | ICD-10-CM | POA: Diagnosis not present

## 2019-03-12 DIAGNOSIS — M6281 Muscle weakness (generalized): Secondary | ICD-10-CM

## 2019-03-12 DIAGNOSIS — M62830 Muscle spasm of back: Secondary | ICD-10-CM
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
## 2019-03-13 ENCOUNTER — Encounter: Payer: Managed Care, Other (non HMO) | Admitting: Physical Therapy

## 2019-03-19 ENCOUNTER — Encounter: Payer: Self-pay | Admitting: Physical Therapy

## 2019-03-19 ENCOUNTER — Other Ambulatory Visit: Payer: Self-pay

## 2019-03-19 ENCOUNTER — Ambulatory Visit: Payer: Managed Care, Other (non HMO) | Admitting: Physical Therapy

## 2019-03-19 DIAGNOSIS — M62830 Muscle spasm of back: Secondary | ICD-10-CM

## 2019-03-19 DIAGNOSIS — M6281 Muscle weakness (generalized): Secondary | ICD-10-CM

## 2019-03-19 DIAGNOSIS — G8929 Other chronic pain: Secondary | ICD-10-CM

## 2019-03-19 DIAGNOSIS — M545 Low back pain, unspecified: Secondary | ICD-10-CM
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
## 2019-03-19 NOTE — Patient Instructions (Signed)
Access Code: [email or web address]  URL: [email or web address]  Date: 03/19/2019  Prepared by: Ruben Im   Exercises  Supine Double Knee to Chest - 3 reps - 1 sets - 30 sec hold - 1x daily - 7x weekly  Hip Flexor Stretch at Edge of Bed - 3 reps - 1 sets - 30 sec hold - 1x daily - 7x weekly  Hooklying Transversus Abdominis Palpation - 10 reps - 1 sets - 5 hold - 5x daily - 7x weekly  Hooklying Isometric Hip Flexion - 10 reps - 1 sets - 1x daily - 7x weekly  Supine 90/90 Shoulder Flexion with Abdominal Bracing - 10 reps - 1 sets - 1x daily - 7x weekly  Clamshell - 10 reps - 3 sets - 1x daily - 7x weekly  Bird Dog - 10 reps - 1 sets - 1x daily - 7x weekly  Seated Piriformis Stretch with Trunk Bend - 3 reps - 1 sets - 30 sec hold - 1x daily - 7x weekly  Seated Transversus Abdominis Bracing - 10 reps - 1 sets - 1x daily - 7x weekly  Standing Transverse Abdominis Contraction - 10 reps - 1 sets - 1x daily - 7x weekly  Standing Shoulder Row with Anchored Resistance - 10 reps - 3 sets - 1x daily - 7x weekly  Shoulder extension with resistance - Neutral - 10 reps - 3 sets - 1x daily - 7x weekly  Sit to Stand - 10 reps - 1 sets - 1x daily - 7x weekly  Superman on Table - 10 reps - 1 sets - 1x daily - 7x weekly  Side Stepping with Resistance at Thighs - 10 reps - 1 sets - 1x daily - 7x weekly  Standing Hip Abduction with Resistance at Thighs - 10 reps - 1 sets - 1x daily - 7x weekly  Quadruped Leg Extension with Resistance - 10 reps - 1 sets - 1x daily - 7x weekly  Quadruped Bird Dog with Arm and Leg Resistance - 10 reps - 1 sets - 1x daily - 7x weekly  Supine 90/90 Shoulder Extension with Resistance - 10 reps - 3 sets - 1x daily - 7x weekly  Supine 90/90 Shoulder Extension with Resistance - 10 reps - 2 sets - 1x daily - 7x weekly  Half Kneel Row W - 10 reps - 2 sets - 1x daily - 7x weekly  Half Kneel Row - 10 reps - 2 sets - 1x daily - 7x weekly  Half Kneeling Chop with Medicine Ball - 10 reps  - 1 sets - 1x daily - 7x weekly  Plank on Knees - 10 reps - 1 sets - 1x daily - 7x weekly

## 2019-03-20 ENCOUNTER — Encounter: Payer: Managed Care, Other (non HMO) | Admitting: Physical Therapy

## 2019-05-07 ENCOUNTER — Ambulatory Visit: Payer: Managed Care, Other (non HMO) | Admitting: Dietician

## 2019-05-11 ENCOUNTER — Ambulatory Visit: Payer: Managed Care, Other (non HMO) | Admitting: Dietician

## 2020-02-06 ENCOUNTER — Other Ambulatory Visit: Payer: Self-pay | Admitting: Family Medicine

## 2020-02-06 DIAGNOSIS — Z1231 Encounter for screening mammogram for malignant neoplasm of breast: Secondary | ICD-10-CM

## 2020-03-07 ENCOUNTER — Ambulatory Visit: Payer: Managed Care, Other (non HMO)

## 2020-04-11 ENCOUNTER — Encounter (HOSPITAL_BASED_OUTPATIENT_CLINIC_OR_DEPARTMENT_OTHER): Payer: Self-pay | Admitting: *Deleted

## 2020-04-11 ENCOUNTER — Other Ambulatory Visit: Payer: Self-pay

## 2020-04-11 ENCOUNTER — Emergency Department (HOSPITAL_BASED_OUTPATIENT_CLINIC_OR_DEPARTMENT_OTHER)
Admission: EM | Admit: 2020-04-11 | Discharge: 2020-04-11 | Disposition: A | Discharge status: Home / Self Care | Payer: Managed Care, Other (non HMO) | Attending: Emergency Medicine | Admitting: Emergency Medicine

## 2020-04-11 DIAGNOSIS — E119 Type 2 diabetes mellitus without complications: Secondary | ICD-10-CM | POA: Insufficient documentation

## 2020-04-11 DIAGNOSIS — Z20822 Contact with and (suspected) exposure to covid-19: Secondary | ICD-10-CM | POA: Diagnosis not present

## 2020-04-11 DIAGNOSIS — Z7984 Long term (current) use of oral hypoglycemic drugs: Secondary | ICD-10-CM | POA: Diagnosis not present

## 2020-04-11 DIAGNOSIS — F17210 Nicotine dependence, cigarettes, uncomplicated: Secondary | ICD-10-CM | POA: Insufficient documentation

## 2020-04-11 DIAGNOSIS — R197 Diarrhea, unspecified: Secondary | ICD-10-CM

## 2020-04-11 DIAGNOSIS — I10 Essential (primary) hypertension: Secondary | ICD-10-CM | POA: Diagnosis not present

## 2020-04-11 DIAGNOSIS — Z79899 Other long term (current) drug therapy: Secondary | ICD-10-CM | POA: Insufficient documentation

## 2020-04-11 LAB — SARS CORONAVIRUS 2 BY RT PCR (HOSPITAL ORDER, PERFORMED IN ~~LOC~~ HOSPITAL LAB): SARS Coronavirus 2: NEGATIVE

## 2020-04-11 NOTE — ED Triage Notes (Signed)
3 days of Covid symptoms after Covid exposure from her daughter. Diarrhea, nausea, headache and dizziness.

## 2020-04-11 NOTE — ED Provider Notes (Signed)
[facility] EMERGENCY DEPARTMENT Provider Note   CSN: [id number] Arrival date & time: 04/11/20  1058     History Chief Complaint  Patient presents with  . Diarrhea    Michalene Debruler is a 44 y.o. female.  HPI   44 year old female with diarrhea.  Recent Covid exposure from her daughter.  Associated nausea, fatigue, headaches and generally not feeling well.  No recorded fever.  Denies any similar respiratory symptoms.  Has not tried taking anything for her diarrhea.  Some crampy abdominal pain.  Past Medical History:  Diagnosis Date  . Diabetes mellitus without complication ([place])   . Hyperlipidemia   . Hypertension     Patient Active Problem List   Diagnosis Date Noted  . Smoker 07/17/2012  . Family history of breast cancer 07/17/2012  . History of abnormal Pap smear 07/17/2012    Past Surgical History:  Procedure Laterality Date  . COLPOSCOPY  2001     OB History    Gravida  2   Para  2   Term  2   Preterm  0   AB  0   Living  2     SAB  0   TAB  0   Ectopic  0   Multiple  0   Live Births              Family History  Problem Relation Age of Onset  . Cancer Mother 60       breast  . Breast cancer Mother   . Cancer Father        prostate  . Hypertension Father   . Diabetes Father     Social History   Tobacco Use  . Smoking status: Current Every Day Smoker    Packs/day: 0.50    Years: 5.00    Pack years: 2.50    Types: Cigarettes  . Smokeless tobacco: Never Used  . Tobacco comment: pt given phone number to main desk to ask for smoking cessation class info  Substance Use Topics  . Alcohol use: Yes    Alcohol/week: 2.0 standard drinks    Types: 2 Standard drinks or equivalent per week  . Drug use: No    Home Medications Prior to Admission medications   Medication Sig Start Date End Date Taking? Authorizing Provider  amLODipine (NORVASC) 5 MG tablet  01/01/20  Yes [provider]  rosuvastatin (CRESTOR) 5 MG  tablet  01/01/20  Yes [provider]  Semaglutide,0.25 or 0.5MG /DOS, (OZEMPIC, 0.25 OR 0.5 MG/DOSE,) 2 MG/1.5ML SOPN INJECT 0.25 MG SUBCUTANEOUSLY ONCE A WEEK 02/18/20  Yes [provider]  valACYclovir (VALTREX) 500 MG tablet Take by mouth. 07/05/19  Yes [provider]  albuterol (PROVENTIL HFA;VENTOLIN HFA) 108 (90 Base) MCG/ACT inhaler Inhale 2 puffs into the lungs every 6 (six) hours as needed for wheezing or shortness of breath. 12/14/17   McManama, Franchot Mimes, FNP  benzonatate (TESSALON) 200 MG capsule Take 1 capsule (200 mg total) by mouth at bedtime as needed for cough. Patient not taking: Reported on 02/06/2019 12/14/17   Maximino Sarin, FNP  cyclobenzaprine (FLEXERIL) 10 MG tablet Take 10 mg by mouth 3 (three) times daily as needed for muscle spasms.    [provider]  hydrochlorothiazide (HYDRODIURIL) 25 MG tablet Take 25 mg by mouth daily.    [provider]  metFORMIN (GLUCOPHAGE) 500 MG tablet Take by mouth.    [provider]  Probiotic Product (PROBIOTIC DAILY PO) Take  by mouth.    [provider]  Vitamin D, Ergocalciferol, (DRISDOL) 50000 units CAPS capsule Take 1 capsule (50,000 Units total) by mouth every 7 (seven) days. Patient not taking: Reported on 02/06/2019 08/17/16   Versie Starks, PA-C    Allergies    Patient has no known allergies.  Review of Systems   Review of Systems All systems reviewed and negative, other than as noted in HPI.  Physical Exam Updated Vital Signs BP (!) 131/92   Pulse 66   Temp 98.7 F (37.1 C) (Oral)   Resp 20   Ht 5\' 4"  (1.626 m)   Wt 99.8 kg   LMP 03/25/2020   SpO2 100%   BMI 37.76 kg/m   Physical Exam Vitals and nursing note reviewed.  Constitutional:      General: She is not in acute distress.    Appearance: She is well-developed.  HENT:     Head: Normocephalic and atraumatic.  Eyes:     General:        Right eye: No discharge.        Left eye: No  discharge.     Conjunctiva/sclera: Conjunctivae normal.  Cardiovascular:     Rate and Rhythm: Normal rate and regular rhythm.     Heart sounds: Normal heart sounds. No murmur heard.  No friction rub. No gallop.   Pulmonary:     Effort: Pulmonary effort is normal. No respiratory distress.     Breath sounds: Normal breath sounds.  Abdominal:     General: There is no distension.     Palpations: Abdomen is soft.     Tenderness: There is no abdominal tenderness.  Musculoskeletal:        General: No tenderness.     Cervical back: Neck supple.  Skin:    General: Skin is warm and dry.  Neurological:     Mental Status: She is alert.  Psychiatric:        Behavior: Behavior normal.        Thought Content: Thought content normal.     ED Results / Procedures / Treatments   Labs (all labs ordered are listed, but only abnormal results are displayed) Labs Reviewed  SARS CORONAVIRUS 2 BY RT PCR (HOSPITAL ORDER, [facility])    EKG None  Radiology No results found.  Procedures Procedures (including critical care time)  Medications Ordered in ED Medications - No data to display  ED Course  I have reviewed the triage vital signs and the nursing notes.  Pertinent labs & imaging results that were available during my care of the patient were reviewed by me and considered in my medical decision making (see chart for details).    MDM Rules/Calculators/A&P                          44 year old female with diarrhea.  Patient Covid exposure.  Afebrile.  No acute respiratory symptoms.  Possibly Covid related.  Abdominal exam is benign.  Symptomatic treatment.  Return precautions discussed.   Perla Echavarria was evaluated in Emergency Department on 04/11/2020 for the symptoms described in the history of present illness. She was evaluated in the context of the global COVID-19 pandemic, which necessitated consideration that the patient might be at risk for infection with  the SARS-CoV-2 virus that causes COVID-19. Institutional protocols and algorithms that pertain to the evaluation of patients at risk for COVID-19 are in a state of rapid  change based on information released by regulatory bodies including the CDC and federal and state organizations. These policies and algorithms were followed during the patient's care in the ED.  Final Clinical Impression(s) / ED Diagnoses Final diagnoses:  Diarrhea, unspecified type  Exposure to COVID-19 virus    Rx / DC Orders ED Discharge Orders    None       Virgel Manifold, MD 04/16/20 [phone]

## 2020-04-26 IMAGING — MG DIGITAL SCREENING BILATERAL MAMMOGRAM WITH TOMO AND CAD
8 series · 8 of 24 positions shown · non-contrast
Comparison: Previous exam(s).

CLINICAL DATA: Screening.

EXAM:
DIGITAL SCREENING BILATERAL MAMMOGRAM WITH TOMO AND CAD

[L MLO synth-2D]
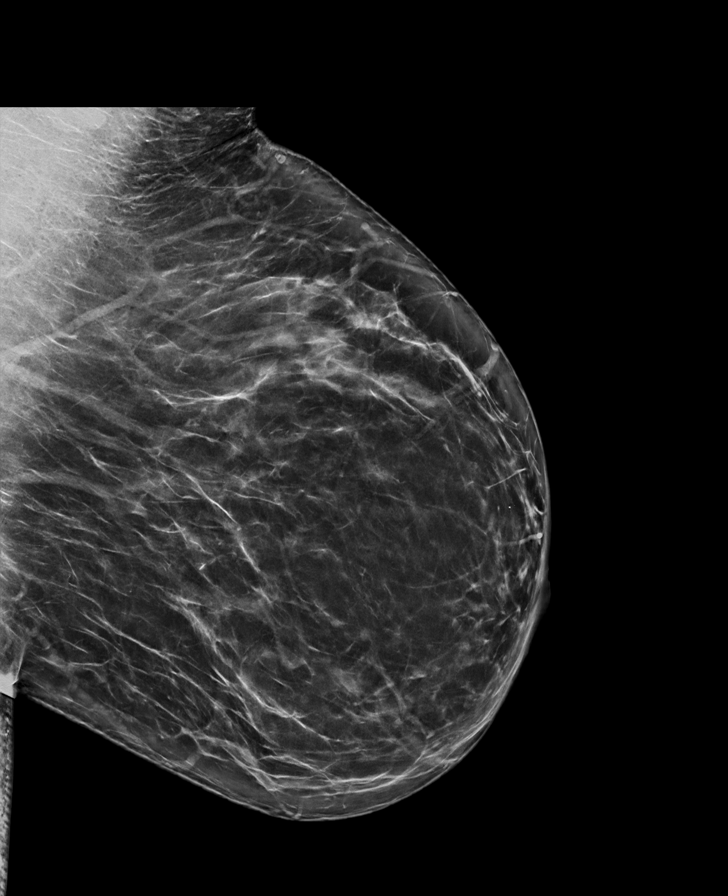

[R MLO synth-2D]
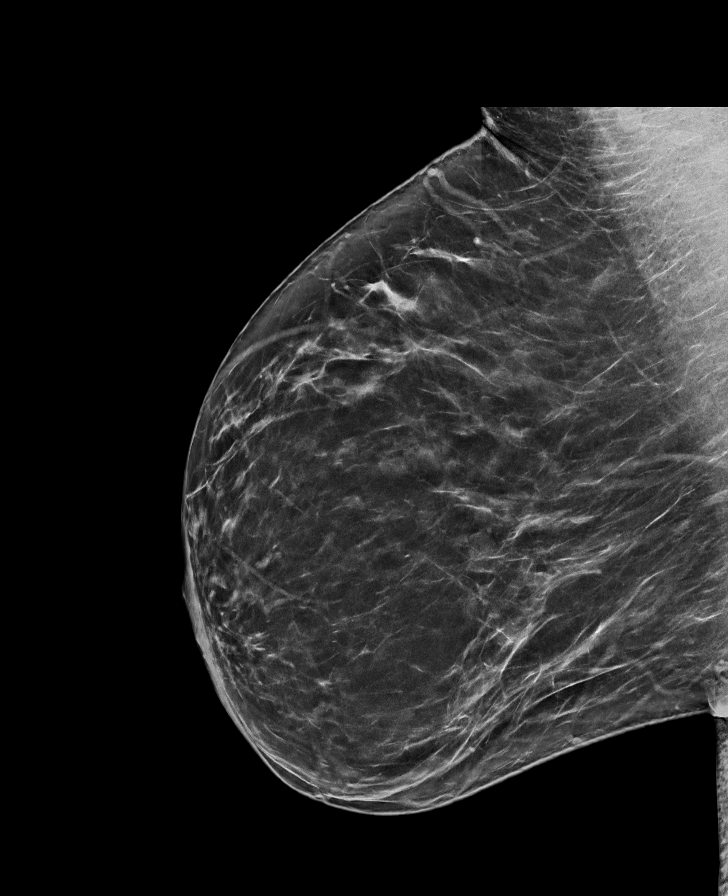

[L CC synth-2D]
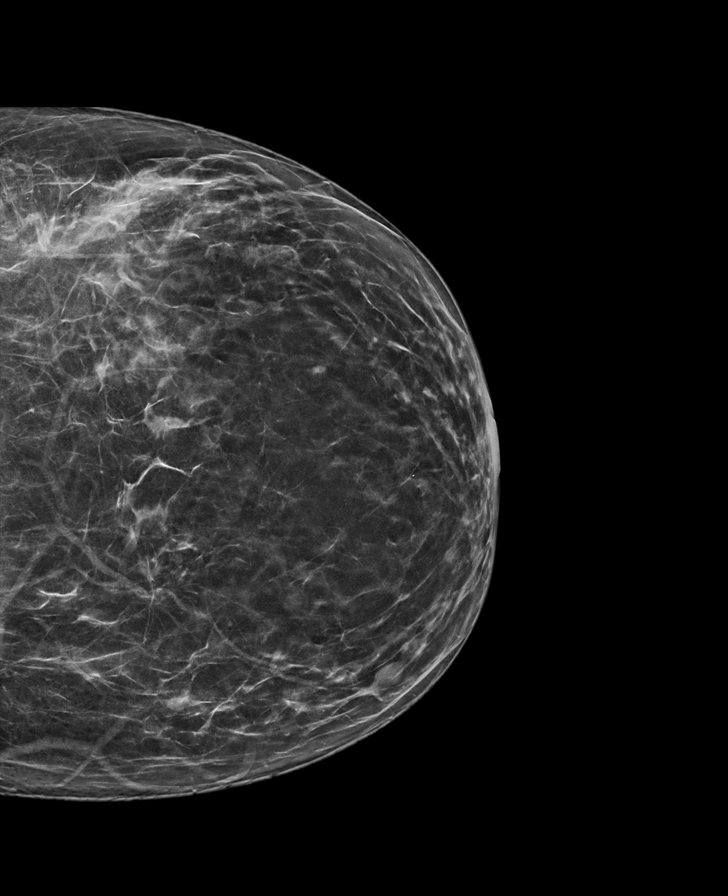

[R CC synth-2D]
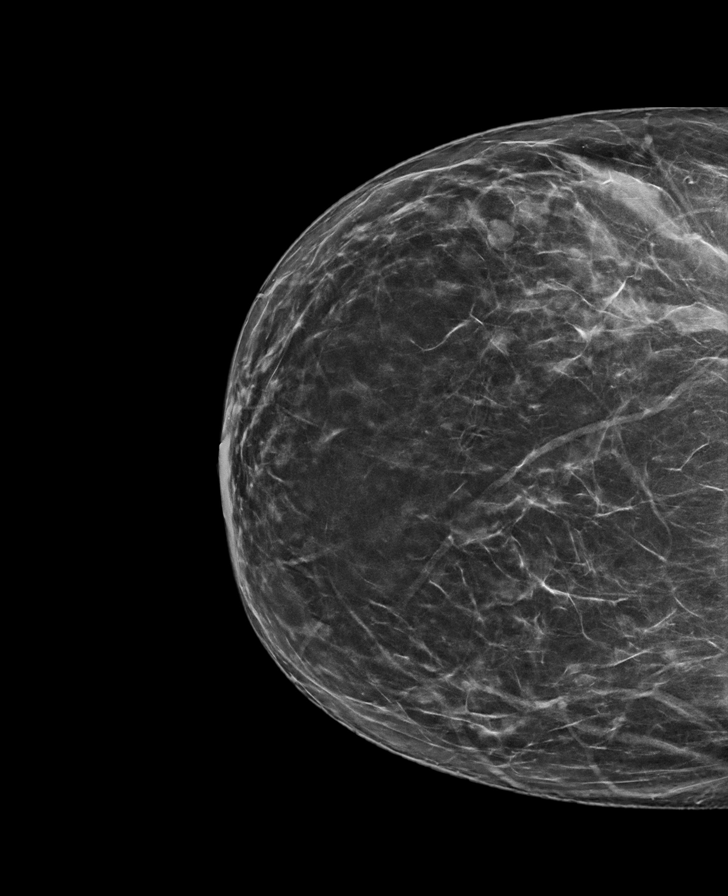

[R MLO tomo · tomo slice 43/86.0]
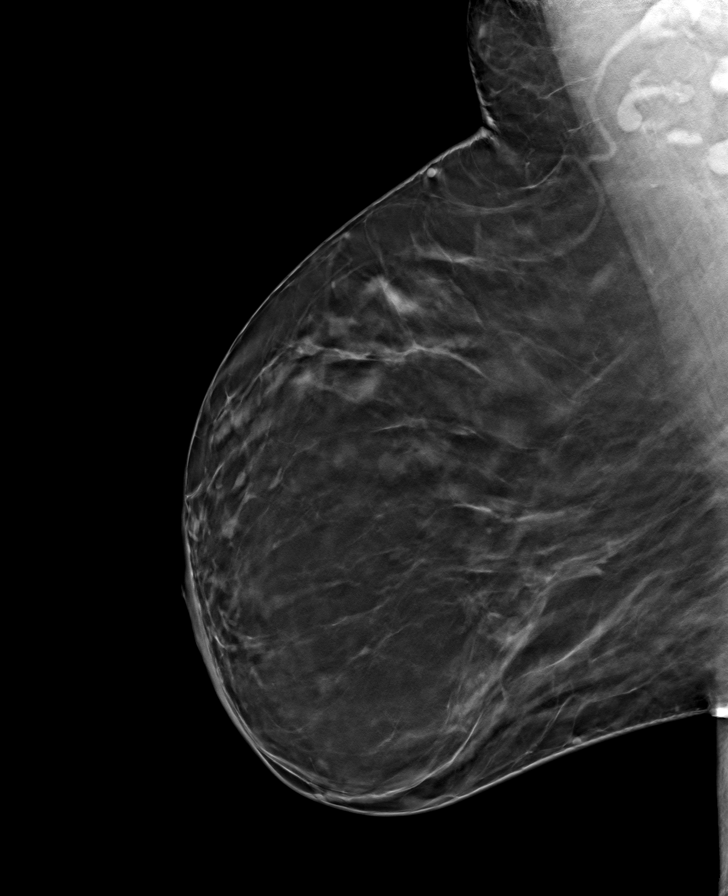

[L MLO tomo · tomo slice 44/87.0]
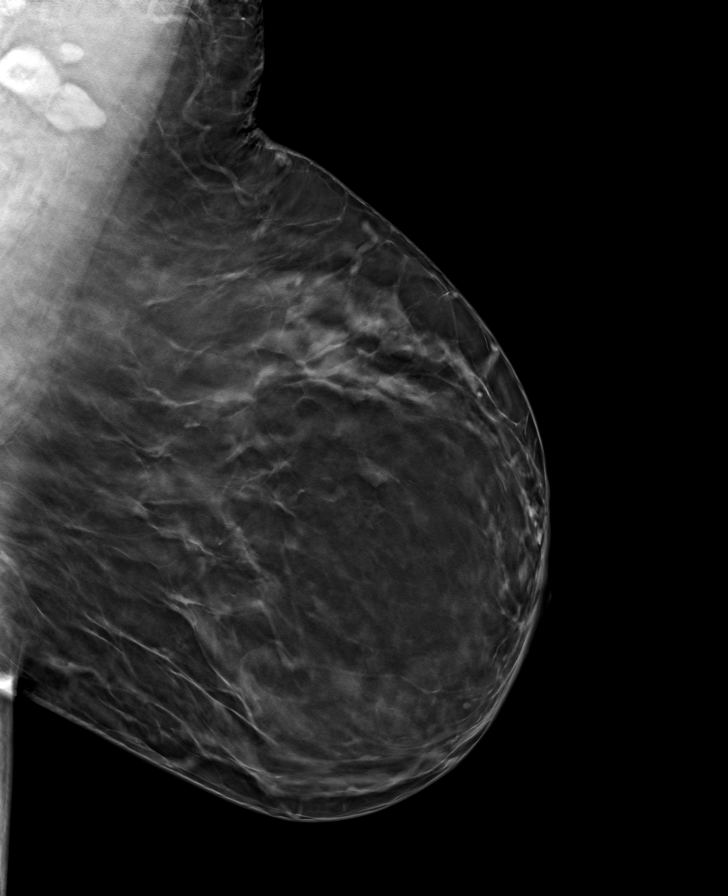

[L CC tomo · tomo slice 37/73.0]
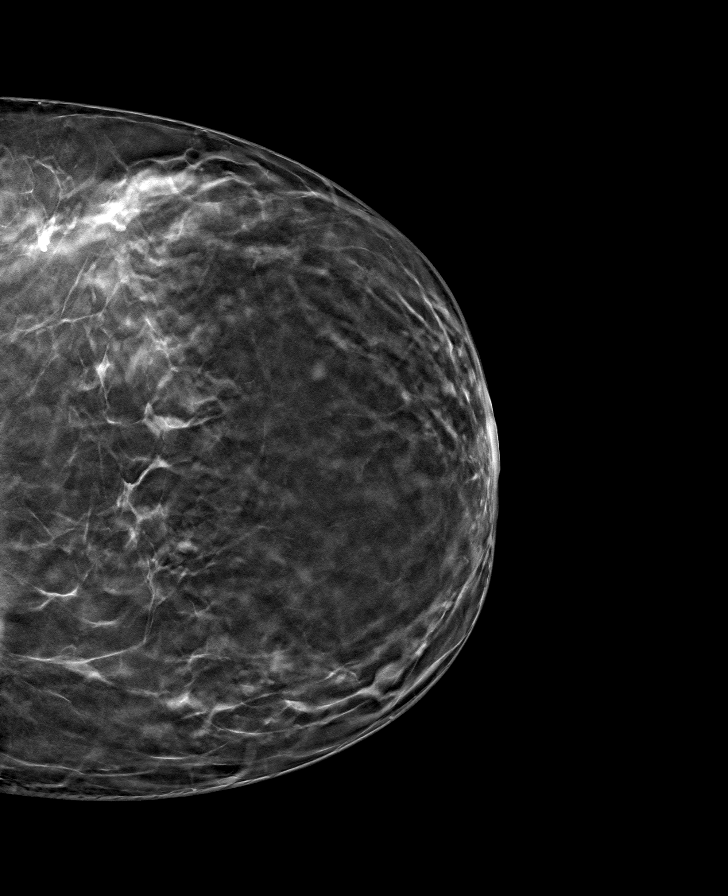

[R CC tomo · tomo slice 37/74.0]
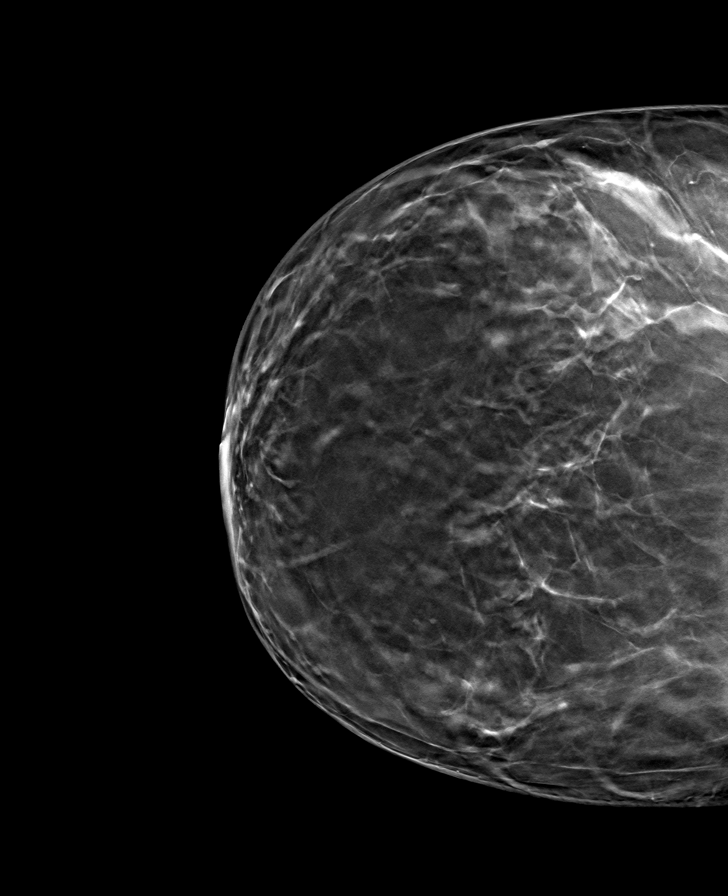

[8 of 24 positions shown; findings below may reference images not displayed]

ACR Breast Density Category b: There are scattered areas of
fibroglandular density.
FINDINGS: In the left breast, a possible asymmetry warrants further
evaluation. In the right breast, no findings suspicious for
malignancy. Images were processed with CAD.
IMPRESSION: Further evaluation is suggested for possible asymmetry in the left
breast.

RECOMMENDATION:
Diagnostic mammogram and possibly ultrasound of the left breast.
(Code:DI-5-LL4)

The patient will be contacted regarding the findings, and additional
imaging will be scheduled.

BI-RADS CATEGORY  0: Incomplete. Need additional imaging evaluation
and/or prior mammograms for comparison.

## 2021-09-04 DIAGNOSIS — E1169 Type 2 diabetes mellitus with other specified complication: Secondary | ICD-10-CM | POA: Diagnosis not present

## 2021-09-04 DIAGNOSIS — Z13220 Encounter for screening for lipoid disorders: Secondary | ICD-10-CM | POA: Diagnosis not present

## 2021-09-04 DIAGNOSIS — Z0000 Encounter for general adult medical examination without abnormal findings: Secondary | ICD-10-CM | POA: Diagnosis not present

## 2021-09-04 DIAGNOSIS — E7800 Pure hypercholesterolemia, unspecified: Secondary | ICD-10-CM | POA: Diagnosis not present

## 2021-09-04 DIAGNOSIS — I10 Essential (primary) hypertension: Secondary | ICD-10-CM | POA: Diagnosis not present

## 2021-10-08 DIAGNOSIS — Z202 Contact with and (suspected) exposure to infections with a predominantly sexual mode of transmission: Secondary | ICD-10-CM | POA: Diagnosis not present

## 2021-10-08 DIAGNOSIS — Z1231 Encounter for screening mammogram for malignant neoplasm of breast: Secondary | ICD-10-CM | POA: Diagnosis not present

## 2021-10-08 DIAGNOSIS — N920 Excessive and frequent menstruation with regular cycle: Secondary | ICD-10-CM | POA: Diagnosis not present

## 2021-10-08 DIAGNOSIS — Z01411 Encounter for gynecological examination (general) (routine) with abnormal findings: Secondary | ICD-10-CM | POA: Diagnosis not present

## 2021-10-08 DIAGNOSIS — Z130 Encounter for screening for diseases of the blood and blood-forming organs and certain disorders involving the immune mechanism: Secondary | ICD-10-CM | POA: Diagnosis not present

## 2021-10-08 DIAGNOSIS — Z6835 Body mass index (BMI) 35.0-35.9, adult: Secondary | ICD-10-CM | POA: Diagnosis not present

## 2021-10-08 DIAGNOSIS — Z1389 Encounter for screening for other disorder: Secondary | ICD-10-CM | POA: Diagnosis not present

## 2021-10-29 DIAGNOSIS — N921 Excessive and frequent menstruation with irregular cycle: Secondary | ICD-10-CM | POA: Diagnosis not present

## 2021-10-29 DIAGNOSIS — D250 Submucous leiomyoma of uterus: Secondary | ICD-10-CM | POA: Diagnosis not present

## 2021-10-29 DIAGNOSIS — E28319 Asymptomatic premature menopause: Secondary | ICD-10-CM | POA: Diagnosis not present

## 2021-10-29 DIAGNOSIS — N920 Excessive and frequent menstruation with regular cycle: Secondary | ICD-10-CM | POA: Diagnosis not present

## 2021-11-10 ENCOUNTER — Other Ambulatory Visit: Payer: Self-pay | Admitting: Obstetrics and Gynecology

## 2021-11-10 ENCOUNTER — Other Ambulatory Visit (HOSPITAL_COMMUNITY): Payer: Self-pay | Admitting: Obstetrics and Gynecology

## 2021-11-10 DIAGNOSIS — N926 Irregular menstruation, unspecified: Secondary | ICD-10-CM

## 2021-11-11 DIAGNOSIS — F4323 Adjustment disorder with mixed anxiety and depressed mood: Secondary | ICD-10-CM | POA: Diagnosis not present

## 2021-11-17 DIAGNOSIS — L400 Psoriasis vulgaris: Secondary | ICD-10-CM | POA: Diagnosis not present

## 2021-11-23 DIAGNOSIS — F4323 Adjustment disorder with mixed anxiety and depressed mood: Secondary | ICD-10-CM | POA: Diagnosis not present

## 2021-12-09 DIAGNOSIS — N62 Hypertrophy of breast: Secondary | ICD-10-CM | POA: Diagnosis not present

## 2021-12-11 DIAGNOSIS — Z0189 Encounter for other specified special examinations: Secondary | ICD-10-CM | POA: Diagnosis not present

## 2021-12-11 DIAGNOSIS — Z01812 Encounter for preprocedural laboratory examination: Secondary | ICD-10-CM | POA: Diagnosis not present

## 2021-12-23 ENCOUNTER — Other Ambulatory Visit: Payer: Self-pay

## 2021-12-23 ENCOUNTER — Encounter (HOSPITAL_BASED_OUTPATIENT_CLINIC_OR_DEPARTMENT_OTHER): Payer: Self-pay | Admitting: Obstetrics and Gynecology

## 2021-12-23 NOTE — Progress Notes (Signed)
Spoke w/ via phone for pre-op interview: patient ?Lab needs dos: UPT, EKG, and I-stat; no surgeon orders yet  ?Lab results: NA ?COVID test: patient states asymptomatic no test needed. ?Arrive at [facility] 12/25/21 ?NPO after MN except clear liquids.Clear liquids from MN until 0545 ?Med rec completed. ?Medications to take morning of surgery: amlodipine ?Diabetic medication: NA ?Patient instructed no nail polish to be worn day of surgery. ?Patient instructed to bring photo id and insurance card day of surgery. ?Patient aware to have Driver (ride ) / caregiver for 24 hours after surgery. (Daughter, Tezhong, to drive) ?Patient Special Instructions: NA ?Pre-Op special Istructions: NA ?Patient verbalized understanding of instructions that were given at this phone interview. ?Patient denies shortness of breath, chest pain, fever, cough at this phone interview.  ?

## 2021-12-24 NOTE — H&P (Signed)
Michaela Thomas is an 46 y.o. female here for a diagnostic hysteroscopy with dilation and curettage and possible removal of essure under ultrasound guidance.  ?Pt has a history of menometrorrhagia. She haf essure placed elsewhere a few years ago. 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 Nl ovaries ? ?Discussed findings and option of surgical removal of essure offered under US guidance. Diagnostic hysteroscopy would also afford better evaluation of uterine cavity and attempt to locate other essure in right tube ? ?Pertinent Gynecological History: ?Menses: with severe dysmenorrhea ?Bleeding: dysfunctional uterine bleeding ?Contraception:  essure ?DES exposure: denies ?Blood transfusions: none ?Sexually transmitted diseases: no past history ?Previous GYN Procedures:  hysteroscopy   ?Last mammogram: normal Date: 2023 ?Last pap: normal Date: 2022 ?OB History: G2, P2002  ? ?Menstrual History: ?Menarche age: 89 ?Patient's last menstrual period was 08/25/2021 (approximate). ?  ? ?Past Medical History:  ?Diagnosis Date  ? Diabetes mellitus without complication ([place])   ? Hyperlipidemia   ? Hypertension   ? ? ?Past Surgical History:  ?Procedure Laterality Date  ? COLPOSCOPY  2001  ? ? ?Family History  ?Problem Relation Age of Onset  ? Cancer Mother 20  ?     breast  ? Breast cancer Mother   ? Cancer Father   ?     prostate  ? Hypertension Father   ? Diabetes Father   ? ? ?Social History:  reports that she has been smoking cigarettes. She has a 2.50 pack-year smoking history. She has never used smokeless tobacco. She reports current alcohol use of about 2.0 standard drinks per week. She reports that she does not use drugs. ? ?Allergies: No Known Allergies ? ?No medications prior to admission.  ? ? ?Review of Systems  ?Constitutional:  Positive for activity change. Negative for fatigue.  ?Eyes:   Negative for photophobia and visual disturbance.  ?Respiratory:  Negative for chest tightness and shortness of breath.   ?Cardiovascular:  Negative for chest pain and palpitations.  ?Gastrointestinal:  Positive for abdominal pain.  ?Genitourinary:  Positive for menstrual problem and pelvic pain. Negative for vaginal discharge.  ?Musculoskeletal:  Negative for back pain.  ?Neurological:  Negative for light-headedness and headaches.  ?Psychiatric/Behavioral:  The patient is not nervous/anxious.   ? ?Height '5\' 4"'$  (1.626 m), weight 90.7 kg, last menstrual period 08/25/2021. ?Physical Exam ?Vitals and nursing note reviewed. Exam conducted with a chaperone present.  ?Constitutional:   ?   Appearance: Normal appearance. She is normal weight.  ?Cardiovascular:  ?   Pulses: Normal pulses.  ?Pulmonary:  ?   Effort: Pulmonary effort is normal.  ?Genitourinary: ?   General: Normal vulva.  ?Musculoskeletal:     ?   General: Normal range of motion.  ?   Cervical back: Normal range of motion.  ?Skin: ?   General: Skin is warm and dry.  ?   Capillary Refill: Capillary refill takes 2 to 3 seconds.  ?Neurological:  ?   General: No focal deficit present.  ?   Mental Status: She is alert and oriented to person, place, and time. Mental status is at baseline.  ?Psychiatric:     ?   Mood and Affect: Mood normal.     ?   Behavior: Behavior normal.     ?   Thought Content: Thought content normal.  ? ? ?No results found for this or  any previous visit (from the past 24 hour(s)). ? ?No results found. ? ?Assessment/Plan: ?46yo G2P2022 female here for diagnostic hysteroscopy with dilation and curettage and possible removal of essure under ultrasound guidance ?- Admit ?-ERAS protocol ?- Verify consent  ?- To OR when ready  ? ?[name] ?12/24/2021, 7:34 PM ? ?

## 2021-12-25 ENCOUNTER — Other Ambulatory Visit: Payer: Self-pay

## 2021-12-25 ENCOUNTER — Encounter (HOSPITAL_BASED_OUTPATIENT_CLINIC_OR_DEPARTMENT_OTHER): Payer: Self-pay | Admitting: Obstetrics and Gynecology

## 2021-12-25 ENCOUNTER — Ambulatory Visit (HOSPITAL_BASED_OUTPATIENT_CLINIC_OR_DEPARTMENT_OTHER)
Admission: RE | Admit: 2021-12-25 | Discharge: 2021-12-25 | Disposition: A | Discharge status: Home / Self Care | Payer: Federal, State, Local not specified - PPO | Attending: Obstetrics and Gynecology | Admitting: Obstetrics and Gynecology

## 2021-12-25 ENCOUNTER — Ambulatory Visit (HOSPITAL_COMMUNITY)
Admission: RE | Admit: 2021-12-25 | Discharge: 2021-12-25 | Disposition: A | Discharge status: Home / Self Care | Payer: Federal, State, Local not specified - PPO | Source: Ambulatory Visit | Attending: Obstetrics and Gynecology | Admitting: Obstetrics and Gynecology

## 2021-12-25 ENCOUNTER — Ambulatory Visit (HOSPITAL_BASED_OUTPATIENT_CLINIC_OR_DEPARTMENT_OTHER): Payer: Federal, State, Local not specified - PPO | Admitting: Anesthesiology

## 2021-12-25 ENCOUNTER — Encounter (HOSPITAL_BASED_OUTPATIENT_CLINIC_OR_DEPARTMENT_OTHER)
Admission: RE | Disposition: A | Discharge status: Home / Self Care | Payer: Self-pay | Source: Home / Self Care | Attending: Obstetrics and Gynecology

## 2021-12-25 DIAGNOSIS — N926 Irregular menstruation, unspecified: Secondary | ICD-10-CM

## 2021-12-25 DIAGNOSIS — T8332XA Displacement of intrauterine contraceptive device, initial encounter: Secondary | ICD-10-CM | POA: Diagnosis not present

## 2021-12-25 DIAGNOSIS — T83428A Displacement of other prosthetic devices, implants and grafts of genital tract, initial encounter: Secondary | ICD-10-CM | POA: Diagnosis not present

## 2021-12-25 DIAGNOSIS — R102 Pelvic and perineal pain: Secondary | ICD-10-CM | POA: Insufficient documentation

## 2021-12-25 DIAGNOSIS — I10 Essential (primary) hypertension: Secondary | ICD-10-CM

## 2021-12-25 DIAGNOSIS — N939 Abnormal uterine and vaginal bleeding, unspecified: Secondary | ICD-10-CM | POA: Diagnosis not present

## 2021-12-25 HISTORY — PX: HYSTEROSCOPY WITH D & C: SHX1775

## 2021-12-25 HISTORY — DX: Dorsalgia, unspecified: M54.9

## 2021-12-25 HISTORY — PX: OPERATIVE ULTRASOUND: SHX5996

## 2021-12-25 LAB — CBC
HCT: 35.9 % — ABNORMAL LOW (ref 36.0–46.0)
Hemoglobin: 12.3 g/dL (ref 12.0–15.0)
MCH: 28.5 pg (ref 26.0–34.0)
MCHC: 34.3 g/dL (ref 30.0–36.0)
MCV: 83.1 fL (ref 80.0–100.0)
Platelets: 438 10*3/uL — ABNORMAL HIGH (ref 150–400)
RBC: 4.32 MIL/uL (ref 3.87–5.11)
RDW: 14.4 % (ref 11.5–15.5)
WBC: 7.1 10*3/uL (ref 4.0–10.5)
nRBC: 0 % (ref 0.0–0.2)

## 2021-12-25 LAB — POCT I-STAT, CHEM 8
BUN: 17 mg/dL (ref 6–20)
Calcium, Ion: 1.26 mmol/L (ref 1.15–1.40)
Chloride: 105 mmol/L (ref 98–111)
Creatinine, Ser: 0.6 mg/dL (ref 0.44–1.00)
Glucose, Bld: 102 mg/dL — ABNORMAL HIGH (ref 70–99)
HCT: 39 % (ref 36.0–46.0)
Hemoglobin: 13.3 g/dL (ref 12.0–15.0)
Potassium: 3.5 mmol/L (ref 3.5–5.1)
Sodium: 140 mmol/L (ref 135–145)
TCO2: 24 mmol/L (ref 22–32)

## 2021-12-25 LAB — TYPE AND SCREEN
ABO/RH(D): B POS
Antibody Screen: NEGATIVE

## 2021-12-25 LAB — ABO/RH: ABO/RH(D): B POS

## 2021-12-25 LAB — POCT PREGNANCY, URINE: Preg Test, Ur: NEGATIVE

## 2021-12-25 LAB — GLUCOSE, CAPILLARY: Glucose-Capillary: 86 mg/dL (ref 70–99)

## 2021-12-25 SURGERY — DILATATION AND CURETTAGE /HYSTEROSCOPY
Anesthesia: General | Site: Uterus

## 2021-12-25 MED ORDER — SODIUM CHLORIDE 0.9 % IR SOLN
Status: DC | PRN
  Administered 2021-12-25: 3000 mL

## 2021-12-25 MED ORDER — FENTANYL CITRATE (PF) 100 MCG/2ML IJ SOLN
INTRAMUSCULAR | Status: DC | PRN
  Administered 2021-12-25: 50 ug via INTRAVENOUS
  Administered 2021-12-25 (×2): 25 ug via INTRAVENOUS

## 2021-12-25 MED ORDER — OXYCODONE-ACETAMINOPHEN 5-325 MG PO TABS
1.0000 | ORAL_TABLET | Freq: Four times a day (QID) | ORAL | 0 refills | Status: AC | PRN
Start: 2021-12-25 — End: 2021-12-28

## 2021-12-25 MED ORDER — LIDOCAINE HCL 1 % IJ SOLN
INTRAMUSCULAR | Status: DC | PRN
  Administered 2021-12-25: 10 mL

## 2021-12-25 MED ORDER — LIDOCAINE 2% (20 MG/ML) 5 ML SYRINGE
INTRAMUSCULAR | Status: DC | PRN
  Administered 2021-12-25: 100 mg via INTRAVENOUS

## 2021-12-25 MED ORDER — POVIDONE-IODINE 10 % EX SWAB: 2.0000 "application " | Freq: Once | CUTANEOUS | Status: DC

## 2021-12-25 MED ORDER — MEPERIDINE HCL 25 MG/ML IJ SOLN: 6.2500 mg | INTRAMUSCULAR | Status: DC | PRN

## 2021-12-25 MED ORDER — PROPOFOL 10 MG/ML IV BOLUS
INTRAVENOUS | Status: AC
  Filled 2021-12-25: qty 20

## 2021-12-25 MED ORDER — ONDANSETRON HCL 4 MG/2ML IJ SOLN
INTRAMUSCULAR | Status: DC | PRN
  Administered 2021-12-25: 4 mg via INTRAVENOUS

## 2021-12-25 MED ORDER — LACTATED RINGERS IV SOLN: INTRAVENOUS | Status: DC

## 2021-12-25 MED ORDER — ONDANSETRON HCL 4 MG/2ML IJ SOLN: 4.0000 mg | Freq: Once | INTRAMUSCULAR | Status: DC | PRN

## 2021-12-25 MED ORDER — ACETAMINOPHEN 500 MG PO TABS
1000.0000 mg | ORAL_TABLET | ORAL | Status: AC
  Administered 2021-12-25: 1000 mg via ORAL

## 2021-12-25 MED ORDER — FENTANYL CITRATE (PF) 100 MCG/2ML IJ SOLN: 25.0000 ug | INTRAMUSCULAR | Status: DC | PRN

## 2021-12-25 MED ORDER — ACETAMINOPHEN 160 MG/5ML PO SOLN: 325.0000 mg | ORAL | Status: DC | PRN

## 2021-12-25 MED ORDER — KETOROLAC TROMETHAMINE 30 MG/ML IJ SOLN
INTRAMUSCULAR | Status: DC | PRN
Start: 2021-12-25 — End: 2021-12-25
  Administered 2021-12-25: 30 mg via INTRAVENOUS

## 2021-12-25 MED ORDER — ACETAMINOPHEN 325 MG PO TABS: 325.0000 mg | ORAL_TABLET | ORAL | Status: DC | PRN

## 2021-12-25 MED ORDER — ACETAMINOPHEN 500 MG PO TABS
ORAL_TABLET | ORAL | Status: AC
  Filled 2021-12-25: qty 2

## 2021-12-25 MED ORDER — MIDAZOLAM HCL 2 MG/2ML IJ SOLN
INTRAMUSCULAR | Status: DC | PRN
Start: 2021-12-25 — End: 2021-12-25
  Administered 2021-12-25: 2 mg via INTRAVENOUS

## 2021-12-25 MED ORDER — PROPOFOL 10 MG/ML IV BOLUS
INTRAVENOUS | Status: DC | PRN
  Administered 2021-12-25: 200 mg via INTRAVENOUS

## 2021-12-25 MED ORDER — DEXAMETHASONE SODIUM PHOSPHATE 10 MG/ML IJ SOLN
INTRAMUSCULAR | Status: DC | PRN
  Administered 2021-12-25: 4 mg via INTRAVENOUS

## 2021-12-25 MED ORDER — OXYCODONE HCL 5 MG/5ML PO SOLN: 5.0000 mg | Freq: Once | ORAL | Status: DC | PRN

## 2021-12-25 MED ORDER — IBUPROFEN 600 MG PO TABS: 600.0000 mg | ORAL_TABLET | Freq: Four times a day (QID) | ORAL | 0 refills | Status: DC | PRN

## 2021-12-25 MED ORDER — MIDAZOLAM HCL 2 MG/2ML IJ SOLN
INTRAMUSCULAR | Status: AC
Start: 2021-12-25 — End: ?
  Filled 2021-12-25: qty 2

## 2021-12-25 MED ORDER — OXYCODONE HCL 5 MG PO TABS: 5.0000 mg | ORAL_TABLET | Freq: Once | ORAL | Status: DC | PRN

## 2021-12-25 MED ORDER — FENTANYL CITRATE (PF) 100 MCG/2ML IJ SOLN
INTRAMUSCULAR | Status: AC
  Filled 2021-12-25: qty 2

## 2021-12-25 MED ORDER — LIDOCAINE HCL (PF) 2 % IJ SOLN
INTRAMUSCULAR | Status: AC
  Filled 2021-12-25: qty 5

## 2021-12-25 MED ORDER — KETOROLAC TROMETHAMINE 30 MG/ML IJ SOLN: 30.0000 mg | Freq: Once | INTRAMUSCULAR | Status: DC

## 2021-12-25 SURGICAL SUPPLY — 15 items
CATH ROBINSON RED A/P 16FR (CATHETERS) ×3 IMPLANT
DILATOR CANAL MILEX (MISCELLANEOUS) IMPLANT
DRSG TELFA 3X8 NADH (GAUZE/BANDAGES/DRESSINGS) ×3 IMPLANT
GAUZE 4X4 16PLY ~~LOC~~+RFID DBL (SPONGE) ×3 IMPLANT
GLOVE BIO SURGEON STRL SZ 6.5 (GLOVE) ×4 IMPLANT
GOWN STRL REUS W/ TWL LRG LVL3 (GOWN DISPOSABLE) IMPLANT
GOWN STRL REUS W/TWL LRG LVL3 (GOWN DISPOSABLE) ×9 IMPLANT
IV NS IRRIG 3000ML ARTHROMATIC (IV SOLUTION) ×3 IMPLANT
KIT PROCEDURE FLUENT (KITS) ×3 IMPLANT
KIT TURNOVER CYSTO (KITS) ×3 IMPLANT
PACK VAGINAL MINOR WOMEN LF (CUSTOM PROCEDURE TRAY) ×3 IMPLANT
PAD DRESSING TELFA 3X8 NADH (GAUZE/BANDAGES/DRESSINGS) ×2 IMPLANT
PAD OB MATERNITY 4.3X12.25 (PERSONAL CARE ITEMS) ×3 IMPLANT
SEAL ROD LENS SCOPE MYOSURE (ABLATOR) ×3 IMPLANT
TOWEL OR 17X26 10 PK STRL BLUE (TOWEL DISPOSABLE) ×6 IMPLANT

## 2021-12-25 NOTE — Anesthesia Preprocedure Evaluation (Addendum)
Anesthesia Evaluation  ?Patient identified by MRN, date of birth, ID band ?Patient awake ? ? ? ?Reviewed: ?Allergy & Precautions, H&P , NPO status , Patient's Chart, lab work & pertinent test results, reviewed documented beta blocker date and time  ? ?Airway ?Mallampati: I ? ?TM Distance: >3 FB ?Neck ROM: full ? ? ? Dental ?no notable dental hx. ?(+) Teeth Intact, Dental Advisory Given, Missing ?  ?Pulmonary ?neg pulmonary ROS, Current Smoker and Patient abstained from smoking.,  ?  ?Pulmonary exam normal ?breath sounds clear to auscultation ? ? ? ? ? ? Cardiovascular ?Exercise Tolerance: Good ?hypertension, Pt. on medications ?negative cardio ROS ? ? ?Rhythm:regular Rate:Normal ? ? ?  ?Neuro/Psych ?negative neurological ROS ? negative psych ROS  ? GI/Hepatic ?negative GI ROS, Neg liver ROS,   ?Endo/Other  ?negative endocrine ROSdiabetes ? Renal/GU ?negative Renal ROS  ?negative genitourinary ?  ?Musculoskeletal ? ? Abdominal ?  ?Peds ? Hematology ?negative hematology ROS ?(+)   ?Anesthesia Other Findings ? ? Reproductive/Obstetrics ?negative OB ROS ? ?  ? ? ? ? ? ? ? ? ? ? ? ? ? ?  ?  ? ? ? ? ? ? ? ?Anesthesia Physical ?Anesthesia Plan ? ?ASA: 2 ? ?Anesthesia Plan: General  ? ?Post-op Pain Management:   ? ?Induction: Intravenous ? ?PONV Risk Score and Plan: 3 and Ondansetron and Dexamethasone ? ?Airway Management Planned: LMA ? ?Additional Equipment: None ? ?Intra-op Plan:  ? ?Post-operative Plan: Extubation in OR ? ?Informed Consent: I have reviewed the patients History and Physical, chart, labs and discussed the procedure including the risks, benefits and alternatives for the proposed anesthesia with the patient or authorized representative who has indicated his/her understanding and acceptance.  ? ? ? ?Dental Advisory Given ? ?Plan Discussed with: CRNA and Anesthesiologist ? ?Anesthesia Plan Comments: ( )  ? ? ? ? ? ? ?Anesthesia Quick Evaluation ? ?

## 2021-12-25 NOTE — Op Note (Signed)
Operative Note ? ? ? ?Preoperative Diagnosis ?Pelvic pain ?Malpositioned Essure ? ? ?Postoperative Diagnosis: Same  ? ? ?Procedure: Ultrasound guided diagnostic hysteroscopy, dilation and currettage, removal of essure coil ? ? ?Surgeon: Mickle Mallory, DO  ? ?Anesthesia: Mac ? ?Fluids: LR 593m ?EBL" <557m?UOP: 30036mFluid deficit: 21m61m?Findings: Constricted uterine cavity consistent with history of uterine ablation, essure coil x 1 in cavity, second coil not visualized, single appearing cavity, follicular phase endometrium  ? ? ?Specimen: Endometrial curettings  ? ? ?Procedure Note  ? ?Consent was confirmed from  pt. ?Patient was taken to the operating room where general anesthesia was administered without difficulty. She was then prepped and draped in the normal sterile fashion while in the dorsal lithotomy position. An appropriate time out was performed.  ?An exam under anesthesia noted an anteverted uterus.  A speculum was then placed within the vagina and the anterior lip of the cervix identified and grasped with a single toothed tenaculum. Uterus was then sounded to 6cm.  The Pratt dilators were utilized to dilate the cervix up to approximately 19.  ?Under ultrasound guidance, a hysteroscopy was performed. The bladder was filled with 300ml42msaline to effect better visualization. Cavity appeared constricted, in follicular phase and who; no septum noted. AN essure coil of about 1cm length was noted protruding into the cavity. It was grasped and easily removed. No bleeding noted. A second essure was absent on ultrasound views. The ostia was also not well visualized.  ?At this point, the hysteroscopy was removed and a sharp curretage performed. Scant endometrial tissue was returned. Sent to pathology for evaluation. ?The speculum was then removed. Pt was cleaned and awakened. She was taken to the recovery room in stable condition.  ?Counts were correct per nursing staff during and at the end of the procedure  ?

## 2021-12-25 NOTE — Interval H&P Note (Signed)
History and Physical Interval Note: ?Pt reports no change since H/P done.  ?Reviewed planned procedure and post op expectations ?Pt verified consent  ?To OR when ready  ? ?12/25/2021 ?8:23 AM ? ?Michaela Thomas  has presented today for surgery, with the diagnosis of irregular bleeding.  The various methods of treatment have been discussed with the patient and family. After consideration of risks, benefits and other options for treatment, the patient has consented to  Procedure(s) with comments: ?DILATATION AND CURETTAGE /HYSTEROSCOPY (Left) - removal of essure in uterus ?OPERATIVE ULTRASOUND (N/A) as a surgical intervention.  The patient's history has been reviewed, patient examined, no change in status, stable for surgery.  I have reviewed the patient's chart and labs.  Questions were answered to the patient's satisfaction.   ? ? ?Michaela Thomas ? ? ?

## 2021-12-25 NOTE — Anesthesia Postprocedure Evaluation (Signed)
Anesthesia Post Note ? ?Patient: Michaela Thomas ? ?Procedure(s) Performed: DILATATION AND CURETTAGE /HYSTEROSCOPY/DIAGNOSTIC HYSTEROSCOPY UNDER ULTRASOUND GUIDANCE WITH ESSURE REMOVAL (Left: Uterus) ?OPERATIVE ULTRASOUND (Uterus) ? ?  ? ?Patient location during evaluation: PACU ?Anesthesia Type: General ?Level of consciousness: awake and alert ?Pain management: pain level controlled ?Vital Signs Assessment: post-procedure vital signs reviewed and stable ?Respiratory status: spontaneous breathing, nonlabored ventilation, respiratory function stable and patient connected to nasal cannula oxygen ?Cardiovascular status: blood pressure returned to baseline and stable ?Postop Assessment: no apparent nausea or vomiting ?Anesthetic complications: no ? ? ?No notable events documented. ? ?Last Vitals:  ?Vitals:  ? 12/25/21 1030 12/25/21 1105  ?BP: 130/90 (!) 125/94  ?Pulse: 67 64  ?Resp: 19 14  ?Temp:  36.6 ?C  ?SpO2: 97% 100%  ?  ?Last Pain:  ?Vitals:  ? 12/25/21 1105  ?TempSrc:   ?PainSc: 2   ? ? ?  ?  ?  ?  ?  ?  ? ?Michaela Thomas ? ? ? ? ?

## 2021-12-25 NOTE — Transfer of Care (Signed)
Immediate Anesthesia Transfer of Care Note ? ?Patient: Michaela Thomas ? ?Procedure(s) Performed: Procedure(s) (LRB): ?DILATATION AND CURETTAGE /HYSTEROSCOPY/DIAGNOSTIC HYSTEROSCOPY UNDER ULTRASOUND GUIDANCE WITH ESSURE REMOVAL (Left) ?OPERATIVE ULTRASOUND (N/A) ? ?Patient Location: PACU ? ?Anesthesia Type: General ? ?Level of Consciousness: sleepy ? ?Airway & Oxygen Therapy: Patient Spontanous Breathing and Patient connected to face mask oxygen, oral airway removed. ? ?Post-op Assessment: Report given to PACU RN and Post -op Vital signs reviewed and stable ? ?Post vital signs: Reviewed and stable ? ?Complications: No apparent anesthesia complications ? ?Last Vitals:  ?Vitals Value Taken Time  ?BP 130/90 12/25/21 0939  ?Temp    ?Pulse 73 12/25/21 0943  ?Resp 17 12/25/21 0943  ?SpO2 100 % 12/25/21 0943  ?Vitals shown include unvalidated device data. ? ?Last Pain:  ?Vitals:  ? 12/25/21 0724  ?TempSrc: Oral  ?PainSc: 5   ?   ? ?Patients Stated Pain Goal: 5 (12/25/21 0724) ? ?Complications: No notable events documented. ?

## 2021-12-25 NOTE — Anesthesia Procedure Notes (Addendum)
Procedure Name: LMA Insertion ?Date/Time: 12/25/2021 8:40 AM ?Performed by: Mechele Claude, CRNA ?Pre-anesthesia Checklist: Patient identified, Emergency Drugs available, Suction available and Patient being monitored ?Patient Re-evaluated:Patient Re-evaluated prior to induction ?Oxygen Delivery Method: Circle system utilized ?Preoxygenation: Pre-oxygenation with 100% oxygen ?Induction Type: IV induction ?Ventilation: Mask ventilation without difficulty ?LMA: LMA inserted ?LMA Size: 4.0 ?Number of attempts: 1 ?Airway Equipment and Method: Bite block ?Placement Confirmation: positive ETCO2 ?Tube secured with: Tape ?Dental Injury: Teeth and Oropharynx as per pre-operative assessment  ? ? ? ? ?

## 2021-12-25 NOTE — Discharge Instructions (Addendum)
Call office with any concerns [phone] ? ? ?NEXT DOSE OF TYLENOL PRODUCTS UNTIL 1:30 PM TODAY. ? ?NEXT DOSE OF IBUPROFEN PRODUCTS UNTIL 3:30 PM TODAY. (IBUPROFEN, ADVIL, MOTRIN, ALEVE, NAPROXEN) ? ? ? ? ?Post Anesthesia Home Care Instructions ? ?Activity: ?Get plenty of rest for the remainder of the day. A responsible individual must stay with you for 24 hours following the procedure.  ?For the next 24 hours, DO NOT: ?-Drive a car ?-Paediatric nurse ?-Drink alcoholic beverages ?-Take any medication unless instructed by your physician ?-Make any legal decisions or sign important papers. ? ?Meals: ?Start with liquid foods such as gelatin or soup. Progress to regular foods as tolerated. Avoid greasy, spicy, heavy foods. If nausea and/or vomiting occur, drink only clear liquids until the nausea and/or vomiting subsides. Call your physician if vomiting continues. ? ?Special Instructions/Symptoms: ?Your throat may feel dry or sore from the anesthesia or the breathing tube placed in your throat during surgery. If this causes discomfort, gargle with warm salt water. The discomfort should disappear within 24 hours. ? ?    ? ?

## 2021-12-28 ENCOUNTER — Encounter (HOSPITAL_BASED_OUTPATIENT_CLINIC_OR_DEPARTMENT_OTHER): Payer: Self-pay | Admitting: Obstetrics and Gynecology

## 2021-12-28 LAB — SURGICAL PATHOLOGY

## 2021-12-29 DIAGNOSIS — Z62819 Personal history of unspecified abuse in childhood: Secondary | ICD-10-CM | POA: Diagnosis not present

## 2021-12-29 DIAGNOSIS — F4320 Adjustment disorder, unspecified: Secondary | ICD-10-CM | POA: Diagnosis not present

## 2021-12-29 DIAGNOSIS — F4323 Adjustment disorder with mixed anxiety and depressed mood: Secondary | ICD-10-CM | POA: Diagnosis not present

## 2021-12-29 DIAGNOSIS — Z639 Problem related to primary support group, unspecified: Secondary | ICD-10-CM | POA: Diagnosis not present

## 2022-03-26 ENCOUNTER — Ambulatory Visit: Payer: Federal, State, Local not specified - PPO | Admitting: Family Medicine

## 2022-03-26 VITALS — BP 132/92 | HR 77 | Temp 98.4°F | Ht 65.0 in | Wt 203.2 lb

## 2022-03-26 DIAGNOSIS — K5900 Constipation, unspecified: Secondary | ICD-10-CM | POA: Diagnosis not present

## 2022-03-26 DIAGNOSIS — Z7689 Persons encountering health services in other specified circumstances: Secondary | ICD-10-CM | POA: Diagnosis not present

## 2022-03-26 DIAGNOSIS — I10 Essential (primary) hypertension: Secondary | ICD-10-CM

## 2022-03-26 DIAGNOSIS — B009 Herpesviral infection, unspecified: Secondary | ICD-10-CM

## 2022-03-26 DIAGNOSIS — E119 Type 2 diabetes mellitus without complications: Secondary | ICD-10-CM | POA: Diagnosis not present

## 2022-03-26 DIAGNOSIS — K219 Gastro-esophageal reflux disease without esophagitis: Secondary | ICD-10-CM

## 2022-03-26 DIAGNOSIS — L679 Hair color and hair shaft abnormality, unspecified: Secondary | ICD-10-CM

## 2022-03-26 NOTE — Progress Notes (Signed)
Patient presents to clinic today to establish care.  SUBJECTIVE: PMH: Pt is a 46 yo female with pmh sig for HTN, HSV, DM II, chronic abdominal pain.  Patient previously seen by Dr. Sela Hilding at [facility].  DM2: -Taking Ozempic 1 mg weekly for the last year -Hemoglobin A1c 6.2% on 10/08/2021 -On rosuvastatin 5 mg daily  GERD  HTN: Taking Norvasc 5 mg and HCTZ 25 mg daily. -Notes hair shedding more.  Thinks may be related to BP medication. -BP at home typically 130s/80s. -Taking rosuvastatin 5 mg daily  HLD: -Taking rosuvastatin 5 mg daily  Paresthesias: -Patient notes right foot fourth and fifth digits are numb intermittently.  Chronic abdominal pain: -Patient notes abdominal pain after having Essure placed in 2016. -Subsequently had NovaSure placed 2017 -Endorses being involved in a class action lawsuit against above device(s). -Followed by OB/GYN, Dr. Crawford Givens  Tobacco use: -Currently smoking 10 cigarettes/day x 10 years. -Patient plans to quit on August 25. -Tried Chantix and patches in the past  Constipation: -Endorses having to strain. -Stool described as pebbles  Allergies: NKDA  Social history: Patient is single.  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  Patient has 2 children.  Patient endorses social alcohol use.  Patient denies drug use.  Health Maintenance: Dental --Primary school teacher Vision --[employer] [place] Immunizations --influenza vaccine 2022 Colonoscopy -- Mammogram --March 2023 PAP --March 2023 LMP--January 2023 Bone Density --   Family medical history: Mom-deceased, breast cancer at age 62, HLD Dad-deceased, alcohol abuse, prostate cancer, COPD, diabetes, HLD, HTN, CVA  Past Medical History:  Diagnosis Date   Back pain    Muscle spasms   Diabetes mellitus without complication ([place])    Hyperlipidemia    Hypertension     Past Surgical History:  Procedure Laterality  Date   COLPOSCOPY  08/24/1999   ESSURE TUBAL LIGATION     2017   HYSTEROSCOPY WITH D & C Left 12/25/2021   Procedure: DILATATION AND CURETTAGE /HYSTEROSCOPY/DIAGNOSTIC HYSTEROSCOPY UNDER ULTRASOUND GUIDANCE WITH ESSURE REMOVAL;  Surgeon: Sherlyn Hay, DO;  Location: [facility];  Service: Gynecology;  Laterality: Left;  removal of essure in uterus   HYSTEROSCOPY WITH NOVASURE     2018   OPERATIVE ULTRASOUND N/A 12/25/2021   Procedure: OPERATIVE ULTRASOUND;  Surgeon: Sherlyn Hay, DO;  Location: [facility];  Service: Gynecology;  Laterality: N/A;    Current Outpatient Medications on File Prior to Visit  Medication Sig Dispense Refill   albuterol (PROVENTIL HFA;VENTOLIN HFA) 108 (90 Base) MCG/ACT inhaler Inhale 2 puffs into the lungs every 6 (six) hours as needed for wheezing or shortness of breath. 1 Inhaler 0   amLODipine (NORVASC) 5 MG tablet      benzonatate (TESSALON) 200 MG capsule Take 1 capsule (200 mg total) by mouth at bedtime as needed for cough. (Patient not taking: Reported on 02/06/2019) 20 capsule 0   cyclobenzaprine (FLEXERIL) 10 MG tablet Take 10 mg by mouth 3 (three) times daily as needed for muscle spasms.     hydrochlorothiazide (HYDRODIURIL) 25 MG tablet Take 25 mg by mouth daily.     ibuprofen (ADVIL) 600 MG tablet Take 1 tablet (600 mg total) by mouth every 6 (six) hours as needed for moderate pain or cramping. 20 tablet 0   metFORMIN (GLUCOPHAGE) 500 MG tablet Take by mouth. (Patient not taking: Reported on 12/25/2021)     naproxen sodium (ALEVE) 220  MG tablet Take 220 mg by mouth.     Probiotic Product (PROBIOTIC DAILY PO) Take by mouth.     rosuvastatin (CRESTOR) 5 MG tablet      Semaglutide,0.25 or 0.'5MG'$ /DOS, (OZEMPIC, 0.25 OR 0.5 MG/DOSE,) 2 MG/1.5ML SOPN INJECT 0.25 MG SUBCUTANEOUSLY ONCE A WEEK     valACYclovir (VALTREX) 500 MG tablet Take by mouth.     Vitamin D, Ergocalciferol, (DRISDOL) 50000 units CAPS capsule Take 1  capsule (50,000 Units total) by mouth every 7 (seven) days. 12 capsule 3   No current facility-administered medications on file prior to visit.    No Known Allergies  Family History  Problem Relation Age of Onset   Cancer Mother 71       breast   Breast cancer Mother    Cancer Father        prostate   Hypertension Father    Diabetes Father     Social History   Socioeconomic History   Marital status: Single    Spouse name: Not on file   Number of children: Not on file   Years of education: Not on file   Highest education level: Not on file  Occupational History   Not on file  Tobacco Use   Smoking status: Every Day    Packs/day: 0.25    Years: 5.00    Total pack years: 1.25    Types: Cigarettes   Smokeless tobacco: Never   Tobacco comments:    pt given phone number to main desk to ask for smoking cessation class info  Substance and Sexual Activity   Alcohol use: Yes    Alcohol/week: 2.0 standard drinks of alcohol    Types: 2 Standard drinks or equivalent per week   Drug use: No   Sexual activity: Yes  Other Topics Concern   Not on file  Social History Narrative   Not on file   Social Determinants of Health   Financial Resource Strain: Not on file  Food Insecurity: Not on file  Transportation Needs: Not on file  Physical Activity: Not on file  Stress: Not on file  Social Connections: Not on file  Intimate Partner Violence: Not on file    ROS General: Denies fever, chills, night sweats, changes in weight, changes in appetite + hair shedding HEENT: Denies headaches, ear pain, changes in vision, rhinorrhea, sore throat CV: Denies CP, palpitations, SOB, orthopnea Pulm: Denies SOB, cough, wheezing GI: Denies nausea, vomiting, diarrhea +constipation, GERD, abdominal pain GU: Denies dysuria, hematuria, frequency, vaginal discharge Msk: Denies muscle cramps, joint pains Neuro: Denies weakness + intermittent paresthesia of right toes Skin: Denies rashes,  bruising Psych: Denies depression, anxiety, hallucinations   BP (!) 132/92 (BP Location: Left Arm, Cuff Size: Normal)   Pulse 77   Temp 98.4 F (36.9 C) (Oral)   Ht '5\' 5"'$  (1.651 m)   Wt 203 lb 3.2 oz (92.2 kg)   LMP 08/26/2021 (Approximate) Comment: reports had tubal ligation  SpO2 98%   BMI 33.81 kg/m   Physical Exam Gen. Pleasant, well developed, well-nourished, in NAD HEENT - [place]/AT, PERRL, EOMI, conjunctive clear, no scleral icterus, no nasal drainage, pharynx without erythema or exudate.  TMs normal bilaterally. Lungs: no use of accessory muscles CTAB, no wheezes, rales or rhonchi Cardiovascular: RRR,  No r/g/m, no peripheral edema Musculoskeletal: No deformities, moves all four extremities, no cyanosis or clubbing, normal tone Neuro:  A&Ox3, CN II-XII intact, normal gait Skin:  Warm, dry, intact, no lesions   No  results found for this or any previous visit (from the past 2160 hour(s)).  Assessment/Plan: Essential hypertension -Controlled -Continue current medications including Norvasc 5 mg and HCTZ 25 mg daily -Patient encouraged to check BP at home, and keep a log to bring with you to clinic -Lifestyle modifications encouraged -We will obtain labs in the next few weeks for CPE.  Further adjustments to BP meds after lab results.  Encounter to establish care -We reviewed the PMH, PSH, FH, SH, Meds and Allergies. -We provided refills for any medications we will prescribe as needed. -We addressed current concerns per orders and patient instructions. -We have asked for records for pertinent exams, studies, vaccines and notes from previous providers. -We have advised patient to follow up per instructions below.  Constipation, unspecified constipation type -Advised medications as well as diet may be contributing to symptoms -Discussed lifestyle modifications -OTC MiraLAX or Colace-senna as needed -Plan: Referral to gastroenterology  Controlled type 2 diabetes mellitus  without complication, without long-term current use of insulin ([facility]) -Hemoglobin A1c 6.2% on 10/08/2021 -Continue Ozempic 1 and subq weekly -Foot exam up-to-date done 09/04/2021 -Patient encouraged to schedule diabetic retinopathy screening -Continue statin  Gastroesophageal reflux disease, unspecified whether esophagitis present -Avoid foods known to cause problems -Advised use of Ozempic can cause gastroparesis. -Plan: Referral to gastroenterology  Hair changes -Possibly related to medications.  Also consider thyroid dysfunction -Discussed obtaining labs during CPE.  HSV infection -Valtrex as needed   F/u as needed for CPE  Grier Mitts, MD

## 2022-04-02 ENCOUNTER — Encounter: Payer: Self-pay | Admitting: Family Medicine

## 2022-04-02 ENCOUNTER — Ambulatory Visit (INDEPENDENT_AMBULATORY_CARE_PROVIDER_SITE_OTHER): Payer: Federal, State, Local not specified - PPO | Admitting: Family Medicine

## 2022-04-02 VITALS — BP 124/80 | HR 75 | Temp 98.4°F | Ht 65.0 in | Wt 202.5 lb

## 2022-04-02 DIAGNOSIS — G8929 Other chronic pain: Secondary | ICD-10-CM

## 2022-04-02 DIAGNOSIS — R109 Unspecified abdominal pain: Secondary | ICD-10-CM | POA: Diagnosis not present

## 2022-04-02 DIAGNOSIS — R202 Paresthesia of skin: Secondary | ICD-10-CM | POA: Diagnosis not present

## 2022-04-02 DIAGNOSIS — Z0000 Encounter for general adult medical examination without abnormal findings: Secondary | ICD-10-CM

## 2022-04-02 DIAGNOSIS — Z1159 Encounter for screening for other viral diseases: Secondary | ICD-10-CM

## 2022-04-02 DIAGNOSIS — M419 Scoliosis, unspecified: Secondary | ICD-10-CM

## 2022-04-02 DIAGNOSIS — M545 Low back pain, unspecified: Secondary | ICD-10-CM | POA: Diagnosis not present

## 2022-04-02 DIAGNOSIS — E119 Type 2 diabetes mellitus without complications: Secondary | ICD-10-CM

## 2022-04-02 LAB — TSH: TSH: 0.95 u[IU]/mL (ref 0.35–5.50)

## 2022-04-02 LAB — LIPID PANEL
Cholesterol: 132 mg/dL (ref 0–200)
HDL: 46.5 mg/dL (ref >39.00)
LDL Cholesterol: 70 mg/dL (ref 0–99)
NonHDL: 85.47
Total CHOL/HDL Ratio: 3
Triglycerides: 75 mg/dL (ref 0.0–149.0)
VLDL: 15 mg/dL (ref 0.0–40.0)

## 2022-04-02 LAB — CBC WITH DIFFERENTIAL/PLATELET
Basophils Absolute: 0 10*3/uL (ref 0.0–0.1)
Basophils Relative: 0.4 % (ref 0.0–3.0)
Eosinophils Absolute: 0.1 10*3/uL (ref 0.0–0.7)
Eosinophils Relative: 2.3 % (ref 0.0–5.0)
HCT: 39.7 % (ref 36.0–46.0)
Hemoglobin: 13.1 g/dL (ref 12.0–15.0)
Lymphocytes Relative: 64.1 % — ABNORMAL HIGH (ref 12.0–46.0)
Lymphs Abs: 3.6 10*3/uL (ref 0.7–4.0)
MCHC: 33 g/dL (ref 30.0–36.0)
MCV: 84.3 fl (ref 78.0–100.0)
Monocytes Absolute: 0.3 10*3/uL (ref 0.1–1.0)
Monocytes Relative: 6.3 % (ref 3.0–12.0)
Neutro Abs: 1.5 10*3/uL (ref 1.4–7.7)
Neutrophils Relative %: 26.9 % — ABNORMAL LOW (ref 43.0–77.0)
Platelets: 397 10*3/uL (ref 150.0–400.0)
RBC: 4.7 Mil/uL (ref 3.87–5.11)
RDW: 14.4 % (ref 11.5–15.5)
WBC: 5.6 10*3/uL (ref 4.0–10.5)

## 2022-04-02 LAB — COMPREHENSIVE METABOLIC PANEL
ALT: 21 U/L (ref 0–35)
AST: 20 U/L (ref 0–37)
Albumin: 4.5 g/dL (ref 3.5–5.2)
Alkaline Phosphatase: 47 U/L (ref 39–117)
BUN: 16 mg/dL (ref 6–23)
CO2: 29 mEq/L (ref 19–32)
Calcium: 10.2 mg/dL (ref 8.4–10.5)
Chloride: 101 mEq/L (ref 96–112)
Creatinine, Ser: 0.77 mg/dL (ref 0.40–1.20)
GFR: 93.8 mL/min (ref >60.00)
Glucose, Bld: 83 mg/dL (ref 70–99)
Potassium: 3.5 mEq/L (ref 3.5–5.1)
Sodium: 136 mEq/L (ref 135–145)
Total Bilirubin: 0.4 mg/dL (ref 0.2–1.2)
Total Protein: 7.7 g/dL (ref 6.0–8.3)

## 2022-04-02 LAB — MICROALBUMIN / CREATININE URINE RATIO
Creatinine,U: 66.8 mg/dL
Microalb Creat Ratio: 1 mg/g (ref 0.0–30.0)
Microalb, Ur: <0.7 mg/dL (ref 0.0–1.9)

## 2022-04-02 LAB — FOLATE: Folate: 11.5 ng/mL (ref >5.9)

## 2022-04-02 LAB — VITAMIN B12: Vitamin B-12: 273 pg/mL (ref 211–911)

## 2022-04-02 LAB — HEMOGLOBIN A1C: Hgb A1c MFr Bld: 6.2 % (ref 4.6–6.5)

## 2022-04-02 LAB — T4, FREE: Free T4: 1 ng/dL (ref 0.60–1.60)

## 2022-04-02 NOTE — Progress Notes (Signed)
Subjective:     Michaela Thomas is a 46 y.o. female and is here for a comprehensive physical exam. The patient reports doing ok.  Patient notes intermittent low back pain.  Started after MCV in 2019.  Previously seen by chiropractor.  Recent episode started after exercising on treadmill with her trainer.  Patient states b/l low back will feel tight.  Pt with h/o scoliosis.  Patient with continued intermittent numbness in right foot fourth and fifth digits.  Patient followed by OB/GYN for chronic abdominal pain.    Social History   Socioeconomic History   Marital status: Single    Spouse name: Not on file   Number of children: Not on file   Years of education: Not on file   Highest education level: Not on file  Occupational History   Not on file  Tobacco Use   Smoking status: Every Day    Packs/day: 0.25    Years: 5.00    Total pack years: 1.25    Types: Cigarettes   Smokeless tobacco: Never   Tobacco comments:    pt given phone number to main desk to ask for smoking cessation class info  Substance and Sexual Activity   Alcohol use: Yes    Alcohol/week: 2.0 standard drinks of alcohol    Types: 2 Standard drinks or equivalent per week   Drug use: No   Sexual activity: Yes  Other Topics Concern   Not on file  Social History Narrative   Not on file   Social Determinants of Health   Financial Resource Strain: Not on file  Food Insecurity: Not on file  Transportation Needs: Not on file  Physical Activity: Not on file  Stress: Not on file  Social Connections: Not on file  Intimate Partner Violence: Not on file   Health Maintenance  Topic Date Due   URINE MICROALBUMIN  Never done   Hepatitis C Screening  Never done   TETANUS/TDAP  Never done   PAP SMEAR-Modifier  07/18/2015   COVID-19 Vaccine (4 - Pfizer series) 10/29/2020   INFLUENZA VACCINE  03/23/2022   HIV Screening  Completed   HPV VACCINES  Aged Out    The following portions of the patient's history were reviewed  and updated as appropriate: allergies, current medications, past family history, past medical history, past social history, past surgical history, and problem list.  Review of Systems Pertinent items noted in HPI and remainder of comprehensive ROS otherwise negative.   Objective:    BP 124/80 (BP Location: Left Arm, Patient Position: Sitting, Cuff Size: Large)   Pulse 75   Temp 98.4 F (36.9 C) (Oral)   Ht '5\' 5"'$  (1.651 m)   Wt 202 lb 8 oz (91.9 kg)   LMP 08/26/2021 (Approximate) Comment: reports had tubal ligation  BMI 33.70 kg/m  General appearance: alert, cooperative, appears stated age, and no distress Head: Normocephalic, without obvious abnormality, atraumatic Eyes: conjunctivae/corneas clear. PERRL, EOM's intact. Fundi benign. Ears: normal TM's and external ear canals both ears Nose: Nares normal. Septum midline. Mucosa normal. No drainage or sinus tenderness. Throat: lips, mucosa, and tongue normal; teeth and gums normal Neck: no adenopathy, no carotid bruit, no JVD, supple, symmetrical, trachea midline, and thyroid not enlarged, symmetric, no tenderness/mass/nodules Back: symmetric, no curvature. ROM normal. No CVA tenderness. Lungs: clear to auscultation bilaterally Heart: regular rate and rhythm, S1, S2 normal, no murmur, click, rub or gallop Abdomen: soft, non-tender; bowel sounds normal; no masses,  no organomegaly Extremities: extremities normal,  atraumatic, no cyanosis or edema Pulses: 2+ and symmetric Skin: Skin color, texture, turgor normal. No rashes or lesions Lymph nodes: Cervical, supraclavicular, and axillary nodes normal. Neurologic: Alert and oriented X 3, normal strength and tone. Normal symmetric reflexes. Normal coordination and gait    Diabetic Foot Exam - Simple   Simple Foot Form Diabetic Foot exam was performed with the following findings: Yes 04/02/2022  1:45 PM  Visual Inspection No deformities, no ulcerations, no other skin breakdown bilaterally:  Yes See comments: Yes Sensation Testing Intact to touch and monofilament testing bilaterally: Yes Pulse Check Posterior Tibialis and Dorsalis pulse intact bilaterally: Yes Comments b/l bunions     Assessment:    Healthy female exam.      Plan:    Anticipatory guidance given including wearing seatbelts, smoke detectors in the home, increasing physical activity, increasing p.o. intake of water and vegetables. -Labs -Pap with OB/GYN done March 2023 -Mammogram done March 2023 -Colonoscopy not yet indicated 2/2 age -immunizations reviewed -Given handout -Next CPE in 1 year See After Visit Summary for Counseling Recommendations   Well adult exam  Paresthesia  - Plan: CBC with Differential/Platelet, TSH, T4, Free, CMP, Vitamin B12, Folate  Chronic bilateral low back pain without sciatica -Supportive care -Discussed ergonomic workspace modifications  Scoliosis, unspecified scoliosis type, unspecified spinal region -Supportive care  Controlled type 2 diabetes mellitus without complication, without long-term current use of insulin ([place])  - Plan: Hemoglobin A1c, Lipid panel, Microalbumin/Creatinine Ratio, Urine  Need for hepatitis C screening test  - Plan: Hep C Antibody  Right sided abdominal pain -Patient with chronic abdominal pain status post Essure placement with subsequent removal and NovaSure placement. -Chronic constipation may also be contributing -Continue follow-up with OB/GYN - Plan: CBC with Differential/Platelet, TSH, T4, Free, CMP  Follow-up as needed  Grier Mitts, MD

## 2022-04-05 LAB — HEPATITIS C ANTIBODY: Hepatitis C Ab: NONREACTIVE

## 2022-04-07 DIAGNOSIS — U071 COVID-19: Secondary | ICD-10-CM | POA: Diagnosis not present

## 2022-04-07 DIAGNOSIS — R051 Acute cough: Secondary | ICD-10-CM | POA: Diagnosis not present

## 2022-04-07 DIAGNOSIS — Z681 Body mass index (BMI) 19 or less, adult: Secondary | ICD-10-CM | POA: Diagnosis not present

## 2022-04-08 ENCOUNTER — Encounter: Payer: Self-pay | Admitting: Family Medicine

## 2022-04-27 ENCOUNTER — Emergency Department (HOSPITAL_BASED_OUTPATIENT_CLINIC_OR_DEPARTMENT_OTHER)
Admission: EM | Admit: 2022-04-27 | Discharge: 2022-04-27 | Disposition: A | Discharge status: Home / Self Care | Payer: Federal, State, Local not specified - PPO | Attending: Emergency Medicine | Admitting: Emergency Medicine

## 2022-04-27 ENCOUNTER — Telehealth: Payer: Self-pay | Admitting: Family Medicine

## 2022-04-27 ENCOUNTER — Other Ambulatory Visit: Payer: Self-pay

## 2022-04-27 ENCOUNTER — Emergency Department (HOSPITAL_BASED_OUTPATIENT_CLINIC_OR_DEPARTMENT_OTHER): Payer: Federal, State, Local not specified - PPO

## 2022-04-27 ENCOUNTER — Encounter (HOSPITAL_BASED_OUTPATIENT_CLINIC_OR_DEPARTMENT_OTHER): Payer: Self-pay | Admitting: Obstetrics and Gynecology

## 2022-04-27 ENCOUNTER — Encounter: Payer: Self-pay | Admitting: Gastroenterology

## 2022-04-27 DIAGNOSIS — Z7984 Long term (current) use of oral hypoglycemic drugs: Secondary | ICD-10-CM | POA: Insufficient documentation

## 2022-04-27 DIAGNOSIS — R109 Unspecified abdominal pain: Secondary | ICD-10-CM | POA: Diagnosis not present

## 2022-04-27 DIAGNOSIS — E119 Type 2 diabetes mellitus without complications: Secondary | ICD-10-CM | POA: Diagnosis not present

## 2022-04-27 DIAGNOSIS — R1031 Right lower quadrant pain: Secondary | ICD-10-CM | POA: Insufficient documentation

## 2022-04-27 DIAGNOSIS — R102 Pelvic and perineal pain: Secondary | ICD-10-CM | POA: Diagnosis not present

## 2022-04-27 DIAGNOSIS — I10 Essential (primary) hypertension: Secondary | ICD-10-CM | POA: Diagnosis not present

## 2022-04-27 DIAGNOSIS — Z79899 Other long term (current) drug therapy: Secondary | ICD-10-CM | POA: Insufficient documentation

## 2022-04-27 DIAGNOSIS — D259 Leiomyoma of uterus, unspecified: Secondary | ICD-10-CM | POA: Diagnosis not present

## 2022-04-27 LAB — WET PREP, GENITAL
Clue Cells Wet Prep HPF POC: NONE SEEN
Sperm: NONE SEEN
Trich, Wet Prep: NONE SEEN
WBC, Wet Prep HPF POC: >=10 — AB (ref <10)
Yeast Wet Prep HPF POC: NONE SEEN

## 2022-04-27 LAB — COMPREHENSIVE METABOLIC PANEL
ALT: 31 U/L (ref 0–44)
AST: 20 U/L (ref 15–41)
Albumin: 4.8 g/dL (ref 3.5–5.0)
Alkaline Phosphatase: 57 U/L (ref 38–126)
Anion gap: 9 (ref 5–15)
BUN: 13 mg/dL (ref 6–20)
CO2: 25 mmol/L (ref 22–32)
Calcium: 10.5 mg/dL — ABNORMAL HIGH (ref 8.9–10.3)
Chloride: 100 mmol/L (ref 98–111)
Creatinine, Ser: 0.7 mg/dL (ref 0.44–1.00)
GFR, Estimated: >60 mL/min (ref >60)
Glucose, Bld: 100 mg/dL — ABNORMAL HIGH (ref 70–99)
Potassium: 3.6 mmol/L (ref 3.5–5.1)
Sodium: 134 mmol/L — ABNORMAL LOW (ref 135–145)
Total Bilirubin: 0.2 mg/dL — ABNORMAL LOW (ref 0.3–1.2)
Total Protein: 8.3 g/dL — ABNORMAL HIGH (ref 6.5–8.1)

## 2022-04-27 LAB — CBC
HCT: 37.2 % (ref 36.0–46.0)
Hemoglobin: 12.6 g/dL (ref 12.0–15.0)
MCH: 27.7 pg (ref 26.0–34.0)
MCHC: 33.9 g/dL (ref 30.0–36.0)
MCV: 81.8 fL (ref 80.0–100.0)
Platelets: 404 10*3/uL — ABNORMAL HIGH (ref 150–400)
RBC: 4.55 MIL/uL (ref 3.87–5.11)
RDW: 14.5 % (ref 11.5–15.5)
WBC: 8.7 10*3/uL (ref 4.0–10.5)
nRBC: 0 % (ref 0.0–0.2)

## 2022-04-27 LAB — URINALYSIS, ROUTINE W REFLEX MICROSCOPIC
Bilirubin Urine: NEGATIVE
Glucose, UA: NEGATIVE mg/dL
Hgb urine dipstick: NEGATIVE
Ketones, ur: NEGATIVE mg/dL
Leukocytes,Ua: NEGATIVE
Nitrite: NEGATIVE
Protein, ur: NEGATIVE mg/dL
Specific Gravity, Urine: 1.022 (ref 1.005–1.030)
pH: 6 (ref 5.0–8.0)

## 2022-04-27 LAB — LIPASE, BLOOD: Lipase: 20 U/L (ref 11–51)

## 2022-04-27 LAB — PREGNANCY, URINE: Preg Test, Ur: NEGATIVE

## 2022-04-27 MED ORDER — MORPHINE SULFATE (PF) 4 MG/ML IV SOLN
4.0000 mg | Freq: Once | INTRAVENOUS | Status: AC
  Administered 2022-04-27: 4 mg via INTRAVENOUS
  Filled 2022-04-27: qty 1

## 2022-04-27 MED ORDER — DIAZEPAM 5 MG/ML IJ SOLN
5.0000 mg | Freq: Once | INTRAMUSCULAR | Status: AC
  Administered 2022-04-27: 5 mg via INTRAVENOUS
  Filled 2022-04-27: qty 2

## 2022-04-27 MED ORDER — IBUPROFEN 600 MG PO TABS
600.0000 mg | ORAL_TABLET | Freq: Four times a day (QID) | ORAL | 0 refills | Status: DC | PRN
Start: 2022-04-27 — End: 2023-04-20

## 2022-04-27 MED ORDER — CYCLOBENZAPRINE HCL 10 MG PO TABS: 10.0000 mg | ORAL_TABLET | Freq: Two times a day (BID) | ORAL | 0 refills | Status: AC | PRN

## 2022-04-27 MED ORDER — IOHEXOL 300 MG/ML  SOLN
100.0000 mL | Freq: Once | INTRAMUSCULAR | Status: AC | PRN
  Administered 2022-04-27: 80 mL via INTRAVENOUS

## 2022-04-27 MED ORDER — MORPHINE SULFATE (PF) 4 MG/ML IV SOLN
6.0000 mg | Freq: Once | INTRAVENOUS | Status: AC
  Administered 2022-04-27: 6 mg via INTRAVENOUS
  Filled 2022-04-27: qty 2

## 2022-04-27 MED ORDER — KETOROLAC TROMETHAMINE 15 MG/ML IJ SOLN
15.0000 mg | Freq: Once | INTRAMUSCULAR | Status: AC
  Administered 2022-04-27: 15 mg via INTRAVENOUS
  Filled 2022-04-27: qty 1

## 2022-04-27 NOTE — ED Notes (Signed)
PA at the Bedside. ?

## 2022-04-27 NOTE — ED Provider Notes (Signed)
[facility]ER[facility] DEPT Provider Note   CSN: [id number] Arrival date & time: 04/27/22  1624     History  Chief Complaint  Patient presents with   Abdominal Pain    Michaela Thomas is a 46 y.o. female.  The history is provided by the patient and medical records. No language interpreter was used.  Abdominal Pain    46 year old female significant history of diabetes, hypertension, hyperlipidemia, who presents for evaluation of abdominal pain.  Patient report for the past 5 days she has had pain to her right lower abdomen.  She described pain as a spasm sensation that is waxing waning but become more constant.  Symptoms moderate to severe in intensity.  She tries taking NSAIDs at home with some improvement.  She does not endorse any fever or chills no nausea vomiting or diarrhea no constipation no dysuria or hematuria no vaginal bleeding or vaginal discharge.  No change in dietary status but does endorse some decreased appetite today.  Last menstrual period was in January of this year.  States that she had a tubal ligation with coil however she has had complication from that procedure and that has been an ongoing issue throughout the year.  She still has an intact appendix.  Home Medications Prior to Admission medications   Medication Sig Start Date End Date Taking? Authorizing Provider  albuterol (PROVENTIL HFA;VENTOLIN HFA) 108 (90 Base) MCG/ACT inhaler Inhale 2 puffs into the lungs every 6 (six) hours as needed for wheezing or shortness of breath. Patient not taking: Reported on 03/26/2022 12/14/17   Maximino Sarin, FNP  amLODipine (NORVASC) 5 MG tablet  01/01/20   [provider]  benzonatate (TESSALON) 200 MG capsule Take 1 capsule (200 mg total) by mouth at bedtime as needed for cough. Patient not taking: Reported on 02/06/2019 12/14/17   Maximino Sarin, FNP  cyclobenzaprine (FLEXERIL) 10 MG tablet Take 10 mg by mouth 3 (three) times daily as needed for  muscle spasms.    [provider]  hydrochlorothiazide (HYDRODIURIL) 25 MG tablet Take 25 mg by mouth daily.    [provider]  metFORMIN (GLUCOPHAGE) 500 MG tablet Take by mouth. Patient not taking: Reported on 12/25/2021    [provider]  Probiotic Product (PROBIOTIC DAILY PO) Take by mouth.    [provider]  rosuvastatin (CRESTOR) 5 MG tablet  01/01/20   [provider]  Semaglutide,0.25 or 0.'5MG'$ /DOS, (OZEMPIC, 0.25 OR 0.5 MG/DOSE,) 2 MG/1.5ML SOPN INJECT 0.25 MG SUBCUTANEOUSLY ONCE A WEEK 02/18/20   [provider]  valACYclovir (VALTREX) 500 MG tablet Take by mouth. PRN 07/05/19   [provider]  Vitamin D, Ergocalciferol, (DRISDOL) 50000 units CAPS capsule Take 1 capsule (50,000 Units total) by mouth every 7 (seven) days. 08/17/16   Versie Starks, PA-C      Allergies    Patient has no known allergies.    Review of Systems   Review of Systems  Gastrointestinal:  Positive for abdominal pain.  All other systems reviewed and are negative.   Physical Exam Updated Vital Signs BP (!) 146/109 (BP Location: Right Arm)   Pulse 83   Temp 98.5 F (36.9 C)   Resp 18   Ht '5\' 4"'$  (1.626 m)   Wt 91.6 kg   SpO2 100%   BMI 34.67 kg/m  Physical Exam Vitals and nursing note reviewed.  Constitutional:      Appearance: She is well-developed.     Comments: Appears uncomfortable  HENT:  Head: Atraumatic.  Eyes:     Conjunctiva/sclera: Conjunctivae normal.  Cardiovascular:     Rate and Rhythm: Normal rate and regular rhythm.  Pulmonary:     Effort: Pulmonary effort is normal.  Abdominal:     General: Abdomen is flat.     Tenderness: There is abdominal tenderness in the right lower quadrant. There is guarding. There is no rebound. Negative signs include Murphy's sign, Rovsing's sign and McBurney's sign.  Musculoskeletal:     Cervical back: Neck supple.  Skin:    Findings: No rash.  Neurological:     Mental Status:  She is alert.  Psychiatric:        Mood and Affect: Mood normal.    ED Results / Procedures / Treatments   Labs (all labs ordered are listed, but only abnormal results are displayed) Labs Reviewed  WET PREP, GENITAL - Abnormal; Notable for the following components:      Result Value   WBC, Wet Prep HPF POC >=10 (*)    All other components within normal limits  COMPREHENSIVE METABOLIC PANEL - Abnormal; Notable for the following components:   Sodium 134 (*)    Glucose, Bld 100 (*)    Calcium 10.5 (*)    Total Protein 8.3 (*)    Total Bilirubin 0.2 (*)    All other components within normal limits  CBC - Abnormal; Notable for the following components:   Platelets 404 (*)    All other components within normal limits  LIPASE, BLOOD  URINALYSIS, ROUTINE W REFLEX MICROSCOPIC  PREGNANCY, URINE  GC/CHLAMYDIA PROBE AMP ([facility]) NOT AT [facility]    EKG None  Radiology US PELVIC COMPLETE W TRANSVAGINAL AND TORSION R/O  Result Date: 04/27/2022 CLINICAL DATA:  Right pelvic pain EXAM: TRANSVAGINAL ULTRASOUND OF PELVIS DOPPLER ULTRASOUND OF OVARIES TECHNIQUE: Transvaginal ultrasound examination of the pelvis was performed including evaluation of the uterus, ovaries, adnexal regions, and pelvic cul-de-sac. Color and duplex Doppler ultrasound was utilized to evaluate blood flow to the ovaries. COMPARISON:  CT done today FINDINGS: Uterus Measurements: 9.1 x 4.8 x 6.8 cm = volume: 155.7 mL. There is inhomogeneous attenuation in myometrium. There is 4.7 x 3.5 cm smooth marginated structure with internal echoes. There is no significant increased vascularity in or adjacent to this structure. There is 4 x 3.4 cm solid appearing fibroid with increased vascularity in left side of uterus. Endometrium Thickness: Not adequately visualized for evaluation. Right ovary Measurements: 3 x 1.5 x 2.4 cm = volume: 5.8 mL. Normal appearance/no adnexal mass. Left ovary Not sonographically visualized. Pulsed Doppler  evaluation demonstrates normal low-resistance arterial and venous waveforms in right ovary. Other findings: There is possible trace amount of free fluid, possibly physiological IMPRESSION: There is 4 cm fibroid in the left side of uterus. There is a smooth marginated hypoechoic structure measuring 4.7 cm in maximum diameter in the right central portion of uterus. This may suggest cystic degeneration of fibroid. Less likely possibility would be some other inflammatory or neoplastic process. Follow-up sonogram in 2-3 months may be considered to re-evaluate this finding. There is vascular flow in the right adnexa. Left ovary is not sonographically visualized and not evaluated. Electronically Signed   By: Elmer Picker M.D.   On: 04/27/2022 20:32   CT ABDOMEN PELVIS W CONTRAST  Result Date: 04/27/2022 CLINICAL DATA:  Three days of right lower quadrant abdominal pain. EXAM: CT ABDOMEN AND PELVIS WITH CONTRAST TECHNIQUE: Multidetector CT imaging of the abdomen and pelvis was performed using  the standard protocol following bolus administration of intravenous contrast. RADIATION DOSE REDUCTION: This exam was performed according to the departmental dose-optimization program which includes automated exposure control, adjustment of the mA and/or kV according to patient size and/or use of iterative reconstruction technique. CONTRAST:  77m OMNIPAQUE IOHEXOL 300 MG/ML  SOLN COMPARISON:  No recent relevant prior examination available for comparison at time dictation. FINDINGS: Lower chest: No acute abnormality. Hepatobiliary: No suspicious hepatic lesion. Gallbladder is unremarkable. No biliary ductal dilation. Pancreas: No pancreatic ductal dilation or evidence of acute inflammation. Spleen: No splenomegaly or focal splenic lesion. Adrenals/Urinary Tract: Bilateral adrenal glands appear normal. No hydronephrosis. Kidneys demonstrate symmetric enhancement. Urinary bladder is unremarkable for degree of distension.  Stomach/Bowel: No radiopaque enteric contrast material was administered. Stomach is unremarkable for degree of distension. No pathologic dilation of small or large bowel. Normal appendix and terminal ileum. No evidence of acute bowel inflammation. Vascular/Lymphatic: Normal caliber abdominal aorta. No pathologically enlarged abdominal or pelvic lymph nodes. Reproductive: Lobular uterine contour with a discrete 3.5 cm mass in the uterine fundus likely reflects uterine leiomyomas. Left-sided pelvic Essure device. Surgical clip along the left side of the uterus. Other: Trace pelvic free fluid is within physiologic normal limits. Musculoskeletal: No acute or significant osseous findings. IMPRESSION: 1. No acute abnormality identified in the abdomen or pelvis. 2. Lobular uterine contour with a discrete uterine fundal mass likely reflects uterine leiomyomas. Electronically Signed   By: JDahlia BailiffM.D.   On: 04/27/2022 18:38    Procedures Pelvic exam  Date/Time: 04/27/2022 6:11 PM  Performed by: TDomenic Moras PA-C Authorized by: TDomenic Moras PA-C  Comments: Chaperone present during exam.  No inguinal lymphadenopathy or inguinal hernia noted.  Normal external genitalia.  Close cervical os free of lesion or rash.  No significant vaginal discharge noted.  Mild discomfort with speculum insertion.  Mild right adnexal tenderness without cervical motion tenderness.   .Critical Care  Performed by: TDomenic Moras PA-C Authorized by: TDomenic Moras PA-C   Critical care provider statement:    Critical care time (minutes):  30   Critical care was time spent personally by me on the following activities:  Development of treatment plan with patient or surrogate, discussions with consultants, evaluation of patient's response to treatment, examination of patient, ordering and review of laboratory studies, ordering and review of radiographic studies, ordering and performing treatments and interventions, pulse oximetry,  re-evaluation of patient's condition and review of old charts     Medications Ordered in ED Medications  morphine (PF) 4 MG/ML injection 4 mg (4 mg Intravenous Given 04/27/22 1731)  morphine (PF) 4 MG/ML injection 6 mg (6 mg Intravenous Given 04/27/22 1814)  iohexol (OMNIPAQUE) 300 MG/ML solution 100 mL (80 mLs Intravenous Contrast Given 04/27/22 1819)  diazepam (VALIUM) injection 5 mg (5 mg Intravenous Given 04/27/22 2100)  ketorolac (TORADOL) 15 MG/ML injection 15 mg (15 mg Intravenous Given 04/27/22 2056)    ED Course/ Medical Decision Making/ A&P                           Medical Decision Making Amount and/or Complexity of Data Reviewed Labs: ordered. Radiology: ordered.  Risk Prescription drug management.   BP (!) 146/109 (BP Location: Right Arm)   Pulse 83   Temp 98.5 F (36.9 C)   Resp 18   Ht '5\' 4"'$  (1.626 m)   Wt 91.6 kg   SpO2 100%   BMI 34.67 kg/m   5:29  PM This is a 46 year old female history of tubal ligation presenting complaining of right lower quadrant abdominal pain ongoing for the past 5 days.  She described pain as a spasm sensation nonradiating, waxing waning but now more persistent of moderate to severe intensity.  Pain improves with ibuprofen and indomethacin at home.  No fever or chills no nausea vomiting diarrhea no change in bowel in her bowel movement.  Last bowel movement was today and was normal.  She reported her last menstrual period was January of this year and since her tubal ligation she has had complication from the surgery.  She is unsure if this is related to it.  She denies any strength activities or heavy lifting or any recent injury.  No urinary symptoms.  On exam, patient is uncomfortable, tenderness to right lower quadrant on palpation with guarding.  Work-up initiated.  Will provide opiate pain medication for symptom control.  5:31 PM Labs obtained independently viewed and interpreted by me.  Her pregnancy test is negative, urinalysis without  signs of urinary tract infection, electrolyte panels are reassuring, normal WBC, normal H&H, normal lipase.  Given the location of her symptoms, will perform pelvic exam to screen for potential STI and will consider ovarian torsion tubo-ovarian abscess however my suspicion is low as it has been ongoing for the past 5 days and her labs are reassuring.  She will also likely benefit from an abdominal pelvis CT scan for further assessment.   6:12 PM I have performed pelvic exam without significant findings to suggest PID.  Low suspicion for ovarian torsion.  Will obtain abdominal pelvis CT scan for further evaluation.  Initial opiate pain medication including morphine 4 mg was given without adequate management of her pain.  Additional pain medication ordered.  7:06 PM Labs and imaging obtained independently viewed interpreted by me and I agree with radiologist interpretation.  Wet prep obtained without signs of infection, normal lipase, electrolyte panels are reassuring, normal WBC, normal H&H, urinalysis without signs of urine tract infection, pregnancy test is negative.  Abdominal pelvis CT scan obtained showing no acute abnormalities.  There is a lobulated uterine fundal mass likely reflect uterine leiomyomas  Given patient ongoing discomfort and a fairly benign abdominal pelvis CT scan, I will order transvaginal ultrasound to rule out ovarian torsion.  8:48 PM Transvaginal ultrasound demonstrates a 4 cm fibroid in the left side of the uterus.  Otherwise have the uterus there is a smooth marginated hypoechoic structure measuring 4.7 cm suggestive of a cystic degeneration is a fibroid or less likely could be an inflammatory or neoplastic process.  Recommend follow-up ultrasound in 2 to 3 months.  There is vascular flow in the right adnexa and left ovary is not visualized and evaluated.  Since patient's pain is on the right side of her pelvic, I suspect it is likely related to this cystic degeneration  of the fibroid.  I did discuss this with patient and also recommend follow-up ultrasound in 2 to 3 months to rule out inflammation or neoplastic.  Additional pain medication given which includes Toradol and Valium.  9:27 PM On reassessment patient reported feeling much better.  She feels comfortable going home.  I do encourage patient to have follow-up ultrasound.  I will prescribe anti-inflammatory medication and muscle relaxant in the meantime.  Return precaution given.  I have considered admission but with improvement of symptoms and no obvious surgical pathology and I felt patient stable to be discharged.  I have considered patient social  determinant of health which includes tobacco use and recommend tobacco cessation.  I have reviewed patient's prior EMR including labs and image and considered my plan of care.        Final Clinical Impression(s) / ED Diagnoses Final diagnoses:  RLQ abdominal pain  Uterine leiomyoma, unspecified location    Rx / DC Orders ED Discharge Orders          Ordered    ibuprofen (ADVIL) 600 MG tablet  Every 6 hours PRN        04/27/22 2130    cyclobenzaprine (FLEXERIL) 10 MG tablet  2 times daily PRN        04/27/22 2130              Domenic Moras, PA-C 04/27/22 2131    Regan Lemming, MD 04/27/22 2228

## 2022-04-27 NOTE — ED Notes (Signed)
Pt transported to Ultrasound.  

## 2022-04-27 NOTE — Discharge Instructions (Signed)
You have been evaluated for your symptoms.  Fortunately no evidence of appendicitis or ovarian torsion.  You do have evidence of uterine fibroids which sometimes can contribute to your pain.  At this time, please take anti-inflammatory medication and muscle relaxant as needed for pain.  Please discuss with your doctor to have a repeat pelvic ultrasound in 2 to 3 months to ensure your uterine fibroid does not change in size or shape and to ensure this is not related to a neoplastic tumor.  Return if you have any concern.

## 2022-04-27 NOTE — Telephone Encounter (Signed)
Patient c/o stomach pain, constipation, seeking guidance on how to handle. Was referred to GI who cannot see her until October. Declined OV

## 2022-04-27 NOTE — ED Notes (Signed)
Pt back from ultrasound.

## 2022-04-27 NOTE — ED Notes (Signed)
Patient transported to and from Radiology.

## 2022-04-27 NOTE — ED Triage Notes (Signed)
Patient reports to the ER for right side pain. Patient reports this has been going on for x3 days. Patient reports when this happened last time she was told she was constipated. Patient reports she was doing at home remedies for constipation with no relief. Patient reports a stabbing in her right lower side.

## 2022-04-28 DIAGNOSIS — R1031 Right lower quadrant pain: Secondary | ICD-10-CM | POA: Diagnosis not present

## 2022-04-29 ENCOUNTER — Telehealth: Payer: Self-pay

## 2022-04-29 LAB — GC/CHLAMYDIA PROBE AMP (~~LOC~~) NOT AT ARMC
Chlamydia: NEGATIVE
Comment: NEGATIVE
Comment: NORMAL
Neisseria Gonorrhea: NEGATIVE

## 2022-04-29 NOTE — Telephone Encounter (Signed)
-  Caller states she has worsening abdominal pain that is becoming more severe. It is on the lower right side. No fever  04/28/2022 2:08:56 PM Go to ED Now Ysidro Evert, RN, [facility] - ED  Pt went to ED on 04/27/22 for RLQ Abdominal Pain (see encounter). Last note: 9:27 PM On reassessment patient reported feeling much better.  She feels comfortable going home.  I do encourage patient to have follow-up ultrasound.  I will prescribe anti-inflammatory medication and muscle relaxant in the meantime.  Return precaution given.  I have considered admission but with improvement of symptoms and no obvious surgical pathology and I felt patient stable to be discharged.  I have considered patient social determinant of health which includes tobacco use and recommend tobacco cessation.  I have reviewed patient's prior EMR including labs and image and considered my plan of care.   04/29/22 1548 - Pt states she went to OB on yesterday. Fibroid on left side, pain on right side. OB thinks it IBS & gave her medication that has been effective. Pt states her pain comes in waves & rates it 5-6 on 0-10 pain scale. Pt states GI referral from Dr Volanda Napoleon until Oct. Last BM - pt thinks it was x 2 days ago. Pt states she has taken MOM with no results. This RN suggested Miralax (which pt says doesn't work) or [employer].

## 2022-05-05 NOTE — Telephone Encounter (Signed)
Unfortunately no further recommendations at this time.  Patient can try calling GI to see if they can move her appointment up.  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

## 2022-05-12 NOTE — Telephone Encounter (Signed)
Was seen in ED  

## 2022-06-04 ENCOUNTER — Telehealth: Payer: Self-pay | Admitting: Family Medicine

## 2022-06-04 NOTE — Telephone Encounter (Signed)
Pt called to follow up on this request. Pt states pharmacy only has 3 left.

## 2022-06-04 NOTE — Telephone Encounter (Signed)
Pt requesting a new prescription for  Semaglutide,0.25 or 0.'5MG'$ /DOS, (OZEMPIC, 0.25 OR 0.5 MG/DOSE,) 2 MG/1.5ML SOPN says the original prescribing provider wrote "use as directed" and the pharmacy won't fill.pt has been out for 3 weeks. Needs 1 ml pen. Pt requests a phone call if clarity is needed.   [id number] [address], [place] Phone:  [phone]  Fax:  [phone]    Is the pharmacy that has it

## 2022-06-08 ENCOUNTER — Ambulatory Visit: Payer: Federal, State, Local not specified - PPO | Admitting: Gastroenterology

## 2022-06-08 ENCOUNTER — Encounter: Payer: Self-pay | Admitting: Gastroenterology

## 2022-06-08 VITALS — BP 130/80 | HR 88 | Ht 64.0 in | Wt 206.8 lb

## 2022-06-08 DIAGNOSIS — R109 Unspecified abdominal pain: Secondary | ICD-10-CM | POA: Diagnosis not present

## 2022-06-08 DIAGNOSIS — R194 Change in bowel habit: Secondary | ICD-10-CM

## 2022-06-08 MED ORDER — DICYCLOMINE HCL 20 MG PO TABS: 20.0000 mg | ORAL_TABLET | Freq: Three times a day (TID) | ORAL | 3 refills | Status: DC

## 2022-06-08 NOTE — Patient Instructions (Addendum)
It was a pleasure to meet you today.  I recommend adding a daily dose of Metamucil or Benefiber.   I will refill your dicyclomine. Please use this liberally.  We discussed a colonoscopy.  Will want to consider pelvic floor physical therapy if the colonoscopy is normal.   COLONOSCOPY:   You have been scheduled for a colonoscopy. Please follow written instructions given to you at your visit today.   PREP:   Please pick up your prep supplies at the pharmacy within the next 1-3 days.  INHALERS:   If you use inhalers (even only as needed), please bring them with you on the day of your procedure.   COLONOSCOPY TIPS:  To reduce nausea and dehydration, stay well hydrated for 3-4 days prior to the exam.  To prevent skin/hemorrhoid irritation - prior to wiping, put A&Dointment or vaseline on the toilet paper. Keep a towel or pad on the bed.  BEFORE STARTING YOUR PREP, drink  64oz of clear liquids in the morning. This will help to flush the colon and will ensure you are well hydrated!!!!  NOTE - This is in addition to the fluids required for to complete your prep. Use of a flavored hard candy, such as grape Anise Salvo, can counteract some of the flavor of the prep and may prevent some nausea.

## 2022-06-08 NOTE — Progress Notes (Addendum)
Referring Provider: Billie Ruddy, MD Primary Care Physician:  Billie Ruddy, MD  Reason for Consultation: Altered bowel habits and reflux   IMPRESSION:  Change in bowel habits with right-sided abdominal pain when constipated.  ? Related to organic GI disease or other causes such as leiomyoma or Essure device. Ozempic may also be contributing. Recent CT scan showed no GI abnormalities.  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   Uterine mass/suspected leiomyoma seen on prior imaging. She has already seen GYN.   Using NSAIDs for pain relief   Mild thrombocytosis on labs while in the ER. ? Etiology or clinical significance  PLAN: - Add a daily Metamucil or Benefiber - Dicyclomine 20 mg QID - Colonoscopy - Briefly discuss pelvic floor PT for pelvic floor dyssnergia    HPI: Michaela Thomas is a 46 y.o. female referred by Dr. Volanda Napoleon for further evaluation of constipation and reflux.  The history is obtained through the patient and review of her electronic health record.  She has a history of pre-diabetes, hypertension, and hyperlipidemia.  She has had a tubal ligation. She is a [employer] representative.   Reports intermittent symptoms for at least 2-3 years. Initially developed severe right sided abdominal pain. Seen in the ED 03/28/19 with [facility] because she was concerned she had appendicitis. CT at that time showed constipation.    She is now experiencing constipation with alternating watery loose stools. There is associated, intermittent severe right sided abdominal pain. Will have a bowel movement every 3-4 days. She has significant straining and a sense of incomplete evacuation. Some improvement in pain with defecation.  She was seen in the ER 04/27/2022 for 5-day history of right lower abdominal pain.  Liver enzymes were normal except for total protein of 8.3.  Albumin normal at 4.8.  Calcium 10.5. CBC normal  except for platelets of 404.  Transvaginal ultrasound showed a 4 cm fibroid in the left uterus a 4.7 m structure in the right uterus with follow-up in 2 to 3 months to reassess the right sided lesion.  CT of the abdomen and pelvis showed no acute findings except for the lobular uterine contour with a discrete uterine fundal mass likely reflecting uterine leiomyoma.  She was treated with Toradol and Valium.  OB prescribed dicyclomine after her ED visit and this provided some relief.  She was told it might be IBS.  She continues to have ongoing pain. Dr. Volanda Napoleon felt it may be due to Essure device. However, the Malpositioned Essure was removed on the right side and her symptoms have not changed.   She is afraid of having recurrent problems that require another trip to the ED. She finds her symptoms recently are much more severe than in 2020.   Initially with symptoms on metformin. When she switched for Ozempic she had nausea and vomiting. She was started on omeprazole which has helped with her nausea.  She was recently off Ozempic for a few weeks due to medication shortage and found her GI symptoms were improved. But, she is concerned that she might have something else. She knows her body and thinks i'ts more than that.   Recent use of ibuprofen and indomethacin to the pain.  No prior endoscopic evaluation.  TSH normal 04/02/22 Normal CMP except for calcium 10.5, glucose 100, sodium 134, total protein 8.3 04/27/22 Normal CBC except for platelets 404 04/27/22  There is no known  family history of colon cancer or polyps. No family history of stomach cancer or other GI malignancy. No family history of inflammatory bowel disease or celiac.    Past Medical History:  Diagnosis Date   Back pain    Muscle spasms   Diabetes mellitus without complication ([place])    Hyperlipidemia    Hypertension     Past Surgical History:  Procedure Laterality Date   COLPOSCOPY  08/24/1999   ESSURE TUBAL LIGATION      2017   HYSTEROSCOPY WITH D & C Left 12/25/2021   Procedure: DILATATION AND CURETTAGE /HYSTEROSCOPY/DIAGNOSTIC HYSTEROSCOPY UNDER ULTRASOUND GUIDANCE WITH ESSURE REMOVAL;  Surgeon: Sherlyn Hay, DO;  Location: [facility];  Service: Gynecology;  Laterality: Left;  removal of essure in uterus   HYSTEROSCOPY WITH NOVASURE     2018   OPERATIVE ULTRASOUND N/A 12/25/2021   Procedure: OPERATIVE ULTRASOUND;  Surgeon: Sherlyn Hay, DO;  Location: [facility];  Service: Gynecology;  Laterality: N/A;    Current Outpatient Medications  Medication Sig Dispense Refill   albuterol (PROVENTIL HFA;VENTOLIN HFA) 108 (90 Base) MCG/ACT inhaler Inhale 2 puffs into the lungs every 6 (six) hours as needed for wheezing or shortness of breath. 1 Inhaler 0   amLODipine (NORVASC) 5 MG tablet      cyclobenzaprine (FLEXERIL) 10 MG tablet Take 1 tablet (10 mg total) by mouth 2 (two) times daily as needed for muscle spasms. 20 tablet 0   hydrochlorothiazide (HYDRODIURIL) 25 MG tablet Take 25 mg by mouth daily.     ibuprofen (ADVIL) 600 MG tablet Take 1 tablet (600 mg total) by mouth every 6 (six) hours as needed. 30 tablet 0   Probiotic Product (PROBIOTIC DAILY PO) Take by mouth.     rosuvastatin (CRESTOR) 5 MG tablet      Semaglutide,0.25 or 0.'5MG'$ /DOS, (OZEMPIC, 0.25 OR 0.5 MG/DOSE,) 2 MG/1.5ML SOPN INJECT 0.25 MG SUBCUTANEOUSLY ONCE A WEEK     valACYclovir (VALTREX) 500 MG tablet Take by mouth. PRN     Vitamin D, Ergocalciferol, (DRISDOL) 50000 units CAPS capsule Take 1 capsule (50,000 Units total) by mouth every 7 (seven) days. 12 capsule 3   No current facility-administered medications for this visit.    Allergies as of 06/08/2022   (No Known Allergies)    Family History  Problem Relation Age of Onset   Cancer Mother 73       breast   Breast cancer Mother    Cancer Father        prostate   Hypertension Father    Diabetes Father      Physical Exam: General:    Alert,  well-nourished, pleasant and cooperative in NAD Head:  Normocephalic and atraumatic. Eyes:  Sclera clear, no icterus.   Conjunctiva pink. Ears:  Normal auditory acuity. Nose:  No deformity, discharge,  or lesions. Mouth:  No deformity or lesions.   Neck:  Supple; no masses or thyromegaly. Lungs:  Clear throughout to auscultation.   No wheezes. Heart:  Regular rate and rhythm; no murmurs. Abdomen:  Soft, nontender, nondistended, normal bowel sounds, no rebound or guarding. No hepatosplenomegaly.  I am unable to reproduce her pain.  Rectal:  Deferred  Msk:  Symmetrical. No boney deformities LAD: No inguinal or umbilical LAD Extremities:  No clubbing or edema. Neurologic:  Alert and  oriented x4;  grossly nonfocal Skin:  Intact without significant lesions or rashes. Psych:  Alert and cooperative. Normal mood and affect.  Quiana Cobaugh L. Tarri Glenn, MD, MPH 06/08/2022, 10:53 AM

## 2022-06-12 ENCOUNTER — Other Ambulatory Visit: Payer: Self-pay | Admitting: Family Medicine

## 2022-06-12 DIAGNOSIS — E119 Type 2 diabetes mellitus without complications: Secondary | ICD-10-CM

## 2022-06-12 MED ORDER — SEMAGLUTIDE (1 MG/DOSE) 4 MG/3ML ~~LOC~~ SOPN: 1.0000 mg | PEN_INJECTOR | SUBCUTANEOUS | 2 refills | Status: DC

## 2022-06-12 NOTE — Telephone Encounter (Signed)
Rx refilled however may be contributing to pt's current stomach issues.

## 2022-06-18 ENCOUNTER — Encounter: Payer: Self-pay | Admitting: Gastroenterology

## 2022-06-18 ENCOUNTER — Ambulatory Visit (AMBULATORY_SURGERY_CENTER): Payer: Federal, State, Local not specified - PPO | Admitting: Gastroenterology

## 2022-06-18 VITALS — BP 122/86 | HR 71 | Temp 97.3°F | Resp 16 | Ht 64.0 in | Wt 206.0 lb

## 2022-06-18 DIAGNOSIS — D124 Benign neoplasm of descending colon: Secondary | ICD-10-CM | POA: Diagnosis not present

## 2022-06-18 DIAGNOSIS — R194 Change in bowel habit: Secondary | ICD-10-CM

## 2022-06-18 DIAGNOSIS — D128 Benign neoplasm of rectum: Secondary | ICD-10-CM | POA: Diagnosis not present

## 2022-06-18 DIAGNOSIS — R109 Unspecified abdominal pain: Secondary | ICD-10-CM | POA: Diagnosis not present

## 2022-06-18 HISTORY — PX: COLONOSCOPY: SHX174

## 2022-06-18 MED ORDER — SODIUM CHLORIDE 0.9 % IV SOLN: 500.0000 mL | Freq: Once | INTRAVENOUS | Status: DC

## 2022-06-18 NOTE — Progress Notes (Unsigned)
Called to room to assist during endoscopic procedure.  Patient ID and intended procedure confirmed with present staff. Received instructions for my participation in the procedure from the performing physician.  

## 2022-06-18 NOTE — Progress Notes (Unsigned)
Indication for colonoscopy: Change in bowel habits, right sided abdominal pain  Please see my 06/08/2022 office note for complete details.  There is been no change in history or physical exam since that time.  She remains an appropriate candidate for monitored anesthesia care in the endoscopy center.

## 2022-06-18 NOTE — Progress Notes (Unsigned)
A and O x3. Report to RN. Tolerated MAC anesthesia well. 

## 2022-06-18 NOTE — Patient Instructions (Signed)
-Handout on polyps  provided  - Patient has a contact number available for emergencies. The signs and symptoms of potential delayed complications were discussed with the patient. Return to normal activities tomorrow. Written discharge instructions were provided to the patient. - Resume previous diet. - Continue present medications. - Await pathology results. - Repeat colonoscopy date to be determined after pending pathology results are reviewed for surveillance. - Emerging evidence supports eating a diet of fruits, vegetables, grains, calcium, and yogurt while reducing red meat and alcohol may reduce the risk of colon cancer. - Thank you for allowing me to be involved in your colon cancer prevention. YOU HAD AN ENDOSCOPIC PROCEDURE TODAY AT [facility]:   Refer to the procedure report that was given to you for any specific questions about what was found during the examination.  If the procedure report does not answer your questions, please call your gastroenterologist to clarify.  If you requested that your care partner not be given the details of your procedure findings, then the procedure report has been included in a sealed envelope for you to review at your convenience later.  YOU SHOULD EXPECT: Some feelings of bloating in the abdomen. Passage of more gas than usual.  Walking can help get rid of the air that was put into your GI tract during the procedure and reduce the bloating. If you had a lower endoscopy (such as a colonoscopy or flexible sigmoidoscopy) you may notice spotting of blood in your stool or on the toilet paper. If you underwent a bowel prep for your procedure, you may not have a normal bowel movement for a few days.  Please Note:  You might notice some irritation and congestion in your nose or some drainage.  This is from the oxygen used during your procedure.  There is no need for concern and it should clear up in a day or so.  SYMPTOMS TO REPORT  IMMEDIATELY:  Following lower endoscopy (colonoscopy or flexible sigmoidoscopy):  Excessive amounts of blood in the stool  Significant tenderness or worsening of abdominal pains  Swelling of the abdomen that is new, acute  Fever of 100F or higher  For urgent or emergent issues, a gastroenterologist can be reached at any hour by calling [phone]. Do not use MyChart messaging for urgent concerns.    DIET:  We do recommend a small meal at first, but then you may proceed to your regular diet.  Drink plenty of fluids but you should avoid alcoholic beverages for 24 hours.  ACTIVITY:  You should plan to take it easy for the rest of today and you should NOT DRIVE or use heavy machinery until tomorrow (because of the sedation medicines used during the test).    FOLLOW UP: Our staff will call the number listed on your records the next business day following your procedure.  We will call around 7:15- 8:00 am to check on you and address any questions or concerns that you may have regarding the information given to you following your procedure. If we do not reach you, we will leave a message.     If any biopsies were taken you will be contacted by phone or by letter within the next 1-3 weeks.  Please call us at [phone] if you have not heard about the biopsies in 3 weeks.    SIGNATURES/CONFIDENTIALITY: You and/or your care partner have signed paperwork which will be entered into your electronic medical record.  These signatures attest to  the fact that that the information above on your After Visit Summary has been reviewed and is understood.  Full responsibility of the confidentiality of this discharge information lies with you and/or your care-partner.

## 2022-06-18 NOTE — Op Note (Signed)
[facility] Patient Name: Michaela Thomas Procedure Date: 06/18/2022 3:55 PM MRN: [id number] Endoscopist: Thornton Park MD, MD, [id number] Age: 46 Referring MD:  Date of Birth: 09/22/1975 Gender: Female Account #: [id number] Procedure:                Colonoscopy Indications:              Abdominal pain, Change in bowel habits Medicines:                Monitored Anesthesia Care Procedure:                Pre-Anesthesia Assessment:                           - Prior to the procedure, a History and Physical                            was performed, and patient medications and                            allergies were reviewed. The patient's tolerance of                            previous anesthesia was also reviewed. The risks                            and benefits of the procedure and the sedation                            options and risks were discussed with the patient.                            All questions were answered, and informed consent                            was obtained. Prior Anticoagulants: The patient has                            taken no anticoagulant or antiplatelet agents. ASA                            Grade Assessment: II - A patient with mild systemic                            disease. After reviewing the risks and benefits,                            the patient was deemed in satisfactory condition to                            undergo the procedure.                           After obtaining informed consent, the colonoscope  was passed under direct vision. Throughout the                            procedure, the patient's blood pressure, pulse, and                            oxygen saturations were monitored continuously. The                            CF [id number] #[id number] was introduced through the anus                            and advanced to the 5 cm into the ileum. A second                            forward view  of the right colon was performed. The                            colonoscopy was performed without difficulty. The                            patient tolerated the procedure well. The quality                            of the bowel preparation was good. The terminal                            ileum, ileocecal valve, appendiceal orifice, and                            rectum were photographed. Scope In: 4:02:59 PM Scope Out: 4:21:29 PM Scope Withdrawal Time: 0 hours 16 minutes 19 seconds  Total Procedure Duration: 0 hours 18 minutes 30 seconds  Findings:                 The perianal and digital rectal examinations were                            normal.                           A 3 mm polyp was found in the rectum. The polyp was                            flat. The polyp was removed with a cold snare.                            Resection and retrieval were complete. Estimated                            blood loss was minimal.                           A 4 mm polyp was found in  the descending colon. The                            polyp was sessile. The polyp was removed with a                            cold snare. Resection and retrieval were complete.                            Estimated blood loss was minimal.                           The remainder of the examined colonic mucosa                            appeared normal. Biopsies for histology were taken                            with a cold forceps from the right colon and left                            colon for evaluation of microscopic colitis.                            Estimated blood loss was minimal.                           The terminal ileum appeared normal. Biopsies were                            taken with a cold forceps for histology. Estimated                            blood loss was minimal. Complications:            No immediate complications. Estimated Blood Loss:     Estimated blood loss was  minimal. Impression:               - One 3 mm polyp in the rectum, removed with a cold                            snare. Resected and retrieved.                           - One 4 mm polyp in the descending colon, removed                            with a cold snare. Resected and retrieved.                           - The entire examined colon is normal. Biopsied.                           - The examined portion of the ileum was normal.  Biopsied. Recommendation:           - Patient has a contact number available for                            emergencies. The signs and symptoms of potential                            delayed complications were discussed with the                            patient. Return to normal activities tomorrow.                            Written discharge instructions were provided to the                            patient.                           - Resume previous diet.                           - Continue present medications.                           - Await pathology results.                           - Repeat colonoscopy date to be determined after                            pending pathology results are reviewed for                            surveillance.                           - Emerging evidence supports eating a diet of                            fruits, vegetables, grains, calcium, and yogurt                            while reducing red meat and alcohol may reduce the                            risk of colon cancer.                           - Thank you for allowing me to be involved in your                            colon cancer prevention. Thornton Park MD, MD 06/18/2022 4:36:43 PM This report has been signed electronically.

## 2022-06-21 ENCOUNTER — Telehealth: Payer: Self-pay | Admitting: *Deleted

## 2022-06-21 NOTE — Telephone Encounter (Signed)
  Follow up Call-     06/18/2022    3:03 PM  Call back number  Post procedure Call Back phone  # [phone]  Permission to leave phone message Yes     Patient questions:  Do you have a fever, pain , or abdominal swelling? No. Pain Score  0 *  Have you tolerated food without any problems? Yes.    Have you been able to return to your normal activities? Yes.    Do you have any questions about your discharge instructions: Diet   No. Medications  No. Follow up visit  No.  Do you have questions or concerns about your Care? No.  Actions: * If pain score is 4 or above: No action needed, pain <4.

## 2022-06-25 ENCOUNTER — Other Ambulatory Visit: Payer: Self-pay

## 2022-06-25 DIAGNOSIS — R109 Unspecified abdominal pain: Secondary | ICD-10-CM

## 2022-06-25 DIAGNOSIS — R194 Change in bowel habit: Secondary | ICD-10-CM

## 2022-07-20 ENCOUNTER — Other Ambulatory Visit: Payer: Self-pay | Admitting: Family Medicine

## 2022-07-22 ENCOUNTER — Other Ambulatory Visit (HOSPITAL_COMMUNITY): Payer: Self-pay

## 2022-07-22 MED ORDER — VALACYCLOVIR HCL 500 MG PO TABS
500.0000 mg | ORAL_TABLET | Freq: Two times a day (BID) | ORAL | 1 refills | Status: AC | PRN
  Filled 2022-07-22: qty 90, 45d supply, fill #0
  Filled 2022-07-28: qty 60, 30d supply, fill #0

## 2022-07-23 ENCOUNTER — Other Ambulatory Visit (HOSPITAL_COMMUNITY): Payer: Self-pay

## 2022-07-28 ENCOUNTER — Encounter (HOSPITAL_COMMUNITY): Payer: Self-pay

## 2022-07-28 ENCOUNTER — Other Ambulatory Visit (HOSPITAL_COMMUNITY): Payer: Self-pay

## 2022-08-03 ENCOUNTER — Ambulatory Visit: Payer: Federal, State, Local not specified - PPO | Attending: Gastroenterology | Admitting: Physical Therapy

## 2022-08-23 DIAGNOSIS — R1031 Right lower quadrant pain: Secondary | ICD-10-CM

## 2022-08-23 HISTORY — DX: Right lower quadrant pain: R10.31

## 2022-08-28 ENCOUNTER — Other Ambulatory Visit: Payer: Self-pay | Admitting: Family Medicine

## 2022-08-28 DIAGNOSIS — I10 Essential (primary) hypertension: Secondary | ICD-10-CM

## 2022-10-05 ENCOUNTER — Other Ambulatory Visit: Payer: Self-pay | Admitting: Gastroenterology

## 2022-10-05 ENCOUNTER — Encounter: Payer: Self-pay | Admitting: Family Medicine

## 2022-10-05 NOTE — Telephone Encounter (Signed)
Please advise 

## 2022-10-11 ENCOUNTER — Encounter: Payer: Self-pay | Admitting: Family Medicine

## 2022-10-11 ENCOUNTER — Telehealth (INDEPENDENT_AMBULATORY_CARE_PROVIDER_SITE_OTHER): Payer: Federal, State, Local not specified - PPO | Admitting: Family Medicine

## 2022-10-11 VITALS — Ht 64.0 in | Wt 206.0 lb

## 2022-10-11 DIAGNOSIS — I10 Essential (primary) hypertension: Secondary | ICD-10-CM

## 2022-10-11 DIAGNOSIS — E119 Type 2 diabetes mellitus without complications: Secondary | ICD-10-CM | POA: Insufficient documentation

## 2022-10-11 NOTE — Progress Notes (Signed)
Virtual Visit via Video Note  I connected with Michaela Thomas on 10/11/22 at  3:30 PM EST by a video enabled telemedicine application 2/2 [id number] pandemic and verified that I am speaking with the correct person using two identifiers.  Location patient: home Location provider:work or home office Persons participating in the virtual visit: patient, provider  I discussed the limitations of evaluation and management by telemedicine and the availability of in person appointments. The patient expressed understanding and agreed to proceed.   HPI: Patient is a 47 year old female with pmh sig for HTN, DM 2 who was seen for follow-up.  Patient made appointment to see if there were medications other than Ozempic that she can take for DM as she feels the medication is giving her stomach issues.  Patient started Ozempic prior to establishing care with this provider.  Currently on Ozempic 1 mg weekly.  Hgb A1C 6.2% on 04/02/22.  In the past metformin regular and extended release caused loose stools that made it difficult for pt to function.  Pt was then put on Ozempic with omperazole which decreased nausea.    Patient referred to GI.  Started on Bentyl.  Notes some improvement in stomach pain.  Patient inquires about reducing overall amount of medications taken.  Inquires if BP meds Norvasc 5 mg or HCTZ 25 mg can cause hair loss/thinning.  Patient states home BP cuff stopped working today.   ROS: See pertinent positives and negatives per HPI.  Past Medical History:  Diagnosis Date   Back pain    Muscle spasms   Diabetes mellitus without complication ([place])    Hyperlipidemia    Hypertension     Past Surgical History:  Procedure Laterality Date   COLPOSCOPY  08/24/1999   ESSURE TUBAL LIGATION     2017   HYSTEROSCOPY WITH D & C Left 12/25/2021   Procedure: DILATATION AND CURETTAGE /HYSTEROSCOPY/DIAGNOSTIC HYSTEROSCOPY UNDER ULTRASOUND GUIDANCE WITH ESSURE REMOVAL;  Surgeon: Sherlyn Hay, DO;   Location: [facility];  Service: Gynecology;  Laterality: Left;  removal of essure in uterus   HYSTEROSCOPY WITH NOVASURE     2018   OPERATIVE ULTRASOUND N/A 12/25/2021   Procedure: OPERATIVE ULTRASOUND;  Surgeon: Sherlyn Hay, DO;  Location: [facility];  Service: Gynecology;  Laterality: N/A;    Family History  Problem Relation Age of Onset   Cancer Mother 73       breast   Breast cancer Mother    Cancer Father        prostate   Hypertension Father    Diabetes Father    Colon cancer Neg Hx    Esophageal cancer Neg Hx    Stomach cancer Neg Hx    Rectal cancer Neg Hx      Current Outpatient Medications:    albuterol (PROVENTIL HFA;VENTOLIN HFA) 108 (90 Base) MCG/ACT inhaler, Inhale 2 puffs into the lungs every 6 (six) hours as needed for wheezing or shortness of breath., Disp: 1 Inhaler, Rfl: 0   amLODipine (NORVASC) 5 MG tablet, , Disp: , Rfl:    cyclobenzaprine (FLEXERIL) 10 MG tablet, Take 1 tablet (10 mg total) by mouth 2 (two) times daily as needed for muscle spasms., Disp: 20 tablet, Rfl: 0   dicyclomine (BENTYL) 20 MG tablet, TAKE 1 TABLET (20 MG TOTAL) BY MOUTH 4 (FOUR) TIMES DAILY - BEFORE MEALS AND AT BEDTIME., Disp: 360 tablet, Rfl: 1   hydrochlorothiazide (HYDRODIURIL) 25 MG tablet, TAKE 1 TABLET BY MOUTH EVERY  DAY, Disp: 90 tablet, Rfl: 3   ibuprofen (ADVIL) 600 MG tablet, Take 1 tablet (600 mg total) by mouth every 6 (six) hours as needed., Disp: 30 tablet, Rfl: 0   Probiotic Product (PROBIOTIC DAILY PO), Take by mouth., Disp: , Rfl:    rosuvastatin (CRESTOR) 5 MG tablet, , Disp: , Rfl:    Semaglutide, 1 MG/DOSE, 4 MG/3ML SOPN, Inject 1 mg into the skin once a week., Disp: 3 mL, Rfl: 2   valACYclovir (VALTREX) 500 MG tablet, Take 1 tablet (500 mg total) by mouth 2 (two) times daily as needed., Disp: 90 tablet, Rfl: 1   Vitamin D, Ergocalciferol, (DRISDOL) 50000 units CAPS capsule, Take 1 capsule (50,000 Units total) by mouth every 7  (seven) days., Disp: 12 capsule, Rfl: 3  EXAM:  VITALS per patient if applicable: RR between 123456 bpm  GENERAL: alert, oriented, appears well and in no acute distress  HEENT: atraumatic, conjunctiva clear, no obvious abnormalities on inspection of external nose and ears  NECK: normal movements of the head and neck  LUNGS: on inspection no signs of respiratory distress, breathing rate appears normal, no obvious gross SOB, gasping or wheezing  CV: no obvious cyanosis  MS: moves all visible extremities without noticeable abnormality  PSYCH/NEURO: pleasant and cooperative, no obvious depression or anxiety, speech and thought processing grossly intact  ASSESSMENT AND PLAN:  Discussed the following assessment and plan:  Controlled type 2 diabetes mellitus without complication, without long-term current use of insulin (HCC) -Hemoglobin A1c 6.2 on 04/02/2022 -Discussed decreasing Ozempic from 1 mg weekly to 0.5 mg weekly.  Offered new Rx for decreased dose, patient declines.  States has another prescription with refills, if not will contact clinic. -Will likely need to completely stop Ozempic given ongoing GI symptoms including abdominal pain.  Advised on potential risk of gastroparesis.  Consider starting glipizide or Farxiga. -Lifestyle modifications encouraged -Metformin caused GI upset  Essential hypertension -Continue current medications including Norvasc 5 mg daily and HCTZ 25 mg daily. -Patient encouraged to obtain new BP cuff for home monitoring. -Lifestyle modifications  F/u in 4-8 wks.  I discussed the assessment and treatment plan with the patient. The patient was provided an opportunity to ask questions and all were answered. The patient agreed with the plan and demonstrated an understanding of the instructions.   The patient was advised to call back or seek an in-person evaluation if the symptoms worsen or if the condition fails to improve as anticipated.    Billie Ruddy, MD

## 2022-10-19 ENCOUNTER — Encounter: Payer: Self-pay | Admitting: Family Medicine

## 2022-10-19 ENCOUNTER — Telehealth (INDEPENDENT_AMBULATORY_CARE_PROVIDER_SITE_OTHER): Payer: Federal, State, Local not specified - PPO | Admitting: Family Medicine

## 2022-10-19 VITALS — Ht 64.0 in

## 2022-10-19 DIAGNOSIS — R1031 Right lower quadrant pain: Secondary | ICD-10-CM

## 2022-10-19 DIAGNOSIS — K5900 Constipation, unspecified: Secondary | ICD-10-CM | POA: Diagnosis not present

## 2022-10-19 MED ORDER — TRAMADOL HCL 50 MG PO TABS: 50.0000 mg | ORAL_TABLET | Freq: Two times a day (BID) | ORAL | 0 refills | Status: AC | PRN

## 2022-10-19 NOTE — Progress Notes (Signed)
Virtual Visit via Video Note I connected with Michaela Thomas on 10/19/22 by a video enabled telemedicine application and verified that I am speaking with the correct person using two identifiers. Location patient: home Location provider:work office Persons participating in the virtual visit: patient, provider  I discussed the limitations of evaluation and management by telemedicine and the availability of in person appointments. The patient expressed understanding and agreed to proceed.  Chief Complaint  Patient presents with   Abdominal Pain   HPI: [name] is a 47 yo female with PMHx significant for DM 2, hyperlipidemia, hypertension,and IBS-C c/o RLQ abdominal pain, which began as a dull pain on Saturday and has progressed to a sharp, stabbing pain 'Sunday. The pain is constant, not radiated. She has experienced  this same pain on two previous occasions, last time in 04/2022.  According to patient, has been determined that the cause of the pain was IBS constipation.  Abdominal CT done on 04/27/22. 1. No acute abnormality identified in the abdomen or pelvis. 2. Lobular uterine contour with a discrete uterine fundal mass likely reflects uterine leiomyomas  She denies any fever, chills, or changes in appetite. She has been evaluated by GI and has undergone colonoscopy.  She has been taking 800 mg of ibuprofen for pain management and started taking Miralax yesterday. Her last bowel movement was two hours ago, with straining required. Negative for changes in bowel habits, nausea, vomiting, melena,blood in stool, or urinary symptoms. Denies abnormal vaginal bleeding or discharge. LMP a year ago.  She underwent procedure to remove a coil from one of her fallopian tube, not sure which one. She follows with gyn regularly.  She is currently taking Ozempic for DM II management, wonders if this medications could be contributing to her abdominal pain and/or constipation.  She has been on medication for  over a year.  ROS: See pertinent positives and negatives per HPI.  Past Medical History:  Diagnosis Date   Back pain    Muscle spasms   Diabetes mellitus without complication (HCC)    Hyperlipidemia    Hypertension     Past Surgical History:  Procedure Laterality Date   COLPOSCOPY  08/24/1999   ESSURE TUBAL LIGATION     2017   HYSTEROSCOPY WITH D & C Left 12/25/2021   Procedure: DILATATION AND CURETTAGE /HYSTEROSCOPY/DIAGNOSTIC HYSTEROSCOPY UNDER ULTRASOUND GUIDANCE WITH ESSURE REMOVAL;  Surgeon: Banga, Cecilia Worema, DO;  Location: [facility];  Service: Gynecology;  Laterality: Left;  removal of essure in uterus   HYSTEROSCOPY WITH NOVASURE     20'$ 18   OPERATIVE ULTRASOUND N/A 12/25/2021   Procedure: OPERATIVE ULTRASOUND;  Surgeon: Sherlyn Hay, DO;  Location: [facility];  Service: Gynecology;  Laterality: N/A;    Family History  Problem Relation Age of Onset   Cancer Mother 69       breast   Breast cancer Mother    Cancer Father        prostate   Hypertension Father    Diabetes Father    Colon cancer Neg Hx    Esophageal cancer Neg Hx    Stomach cancer Neg Hx    Rectal cancer Neg Hx    Social History   Socioeconomic History   Marital status: Single    Spouse name: Not on file   Number of children: Not on file   Years of education: Not on file   Highest education level: Not on file  Occupational History   Occupation:  Clinical Social Worker  Tobacco Use   Smoking status: Every Day    Packs/day: 0.25    Years: 5.00    Total pack years: 1.25    Types: Cigarettes   Smokeless tobacco: Never   Tobacco comments:    pt given phone number to main desk to ask for smoking cessation class info  Vaping Use   Vaping Use: Never used  Substance and Sexual Activity   Alcohol use: Yes    Alcohol/week: 2.0 standard drinks of alcohol    Types: 2 Standard drinks or equivalent per week   Drug use: No   Sexual activity: Yes  Other  Topics Concern   Not on file  Social History Narrative   Not on file   Social Determinants of Health   Financial Resource Strain: Not on file  Food Insecurity: Not on file  Transportation Needs: Not on file  Physical Activity: Not on file  Stress: Not on file  Social Connections: Not on file  Intimate Partner Violence: Not on file   Current Outpatient Medications:    albuterol (PROVENTIL HFA;VENTOLIN HFA) 108 (90 Base) MCG/ACT inhaler, Inhale 2 puffs into the lungs every 6 (six) hours as needed for wheezing or shortness of breath., Disp: 1 Inhaler, Rfl: 0   amLODipine (NORVASC) 5 MG tablet, , Disp: , Rfl:    cyclobenzaprine (FLEXERIL) 10 MG tablet, Take 1 tablet (10 mg total) by mouth 2 (two) times daily as needed for muscle spasms., Disp: 20 tablet, Rfl: 0   dicyclomine (BENTYL) 20 MG tablet, TAKE 1 TABLET (20 MG TOTAL) BY MOUTH 4 (FOUR) TIMES DAILY - BEFORE MEALS AND AT BEDTIME., Disp: 360 tablet, Rfl: 1   hydrochlorothiazide (HYDRODIURIL) 25 MG tablet, TAKE 1 TABLET BY MOUTH EVERY DAY, Disp: 90 tablet, Rfl: 3   ibuprofen (ADVIL) 600 MG tablet, Take 1 tablet (600 mg total) by mouth every 6 (six) hours as needed., Disp: 30 tablet, Rfl: 0   Probiotic Product (PROBIOTIC DAILY PO), Take by mouth., Disp: , Rfl:    rosuvastatin (CRESTOR) 5 MG tablet, , Disp: , Rfl:    Semaglutide, 1 MG/DOSE, 4 MG/3ML SOPN, Inject 1 mg into the skin once a week., Disp: 3 mL, Rfl: 2   traMADol (ULTRAM) 50 MG tablet, Take 1 tablet (50 mg total) by mouth every 12 (twelve) hours as needed for up to 5 days., Disp: 10 tablet, Rfl: 0   valACYclovir (VALTREX) 500 MG tablet, Take 1 tablet (500 mg total) by mouth 2 (two) times daily as needed., Disp: 90 tablet, Rfl: 1   Vitamin D, Ergocalciferol, (DRISDOL) 50000 units CAPS capsule, Take 1 capsule (50,000 Units total) by mouth every 7 (seven) days., Disp: 12 capsule, Rfl: 3  EXAM:  VITALS per patient if applicable:Ht '5\' 4"'$  (1.626 m)   BMI 35.36 kg/m   GENERAL:  alert, oriented, appears well and in no acute distress  HEENT: atraumatic, conjunctiva clear, no obvious abnormalities on inspection.  NECK: normal movements of the head and neck  LUNGS: on inspection no signs of respiratory distress, breathing rate appears normal, no obvious gross SOB, gasping or wheezing  CV: no obvious cyanosis  Abdomen: No pain when she palpates area at this time or when asked to cough.  MS: moves all visible extremities without noticeable abnormality  PSYCH/NEURO: pleasant and cooperative, no obvious depression, + anxious. Speech and thought processing grossly intact  ASSESSMENT AND PLAN:  Discussed the following assessment and plan:  RLQ abdominal pain We discussed possible etiologies.  She reports having 2 other similar episodes in the past. I will think imaging or labs are needed at this time. In regard to pain management, she can continue ibuprofen 600 to 800 mg 3 times daily with food and Tylenol 500 mg 3-4 times per day as needed. She would like something stronger for pain, tramadol 50 mg twice daily as needed sent to her pharmacy.  We discussed side effects, including worsening constipation. I do not think Ozempic is causing the problem.  She was clearly instructed about warning signs.  -     traMADol HCl; Take 1 tablet (50 mg total) by mouth every 12 (twelve) hours as needed for up to 5 days.  Dispense: 10 tablet; Refill: 0  Constipation, unspecified constipation type Recommend MiraLAX every other day. Continue adequate fluid and fiber intake.  We discussed possible serious and likely etiologies, options for evaluation and workup, limitations of telemedicine visit vs in person visit, treatment, treatment risks and precautions. The patient was advised to call back or seek an in-person evaluation if the symptoms worsen or if the condition fails to improve as anticipated. I discussed the assessment and treatment plan with the patient. The patient was  provided an opportunity to ask questions and all were answered. The patient agreed with the plan and demonstrated an understanding of the instructions.  Return if symptoms worsen or fail to improve.  Darrelle Barrell G. Martinique, MD  [facility]. [place] office.

## 2022-10-20 ENCOUNTER — Emergency Department (HOSPITAL_BASED_OUTPATIENT_CLINIC_OR_DEPARTMENT_OTHER): Payer: Federal, State, Local not specified - PPO

## 2022-10-20 ENCOUNTER — Emergency Department (HOSPITAL_BASED_OUTPATIENT_CLINIC_OR_DEPARTMENT_OTHER)
Admission: EM | Admit: 2022-10-20 | Discharge: 2022-10-20 | Disposition: A | Discharge status: Home / Self Care | Payer: Federal, State, Local not specified - PPO | Attending: Emergency Medicine | Admitting: Emergency Medicine

## 2022-10-20 ENCOUNTER — Encounter: Payer: Self-pay | Admitting: Gastroenterology

## 2022-10-20 ENCOUNTER — Encounter (HOSPITAL_BASED_OUTPATIENT_CLINIC_OR_DEPARTMENT_OTHER): Payer: Self-pay

## 2022-10-20 ENCOUNTER — Other Ambulatory Visit: Payer: Self-pay

## 2022-10-20 DIAGNOSIS — Z79899 Other long term (current) drug therapy: Secondary | ICD-10-CM | POA: Diagnosis not present

## 2022-10-20 DIAGNOSIS — F17210 Nicotine dependence, cigarettes, uncomplicated: Secondary | ICD-10-CM | POA: Insufficient documentation

## 2022-10-20 DIAGNOSIS — I10 Essential (primary) hypertension: Secondary | ICD-10-CM | POA: Diagnosis not present

## 2022-10-20 DIAGNOSIS — D259 Leiomyoma of uterus, unspecified: Secondary | ICD-10-CM | POA: Diagnosis not present

## 2022-10-20 DIAGNOSIS — R109 Unspecified abdominal pain: Secondary | ICD-10-CM | POA: Diagnosis not present

## 2022-10-20 DIAGNOSIS — R1031 Right lower quadrant pain: Secondary | ICD-10-CM | POA: Insufficient documentation

## 2022-10-20 DIAGNOSIS — E119 Type 2 diabetes mellitus without complications: Secondary | ICD-10-CM | POA: Insufficient documentation

## 2022-10-20 LAB — COMPREHENSIVE METABOLIC PANEL
ALT: 41 U/L (ref 0–44)
AST: 23 U/L (ref 15–41)
Albumin: 5 g/dL (ref 3.5–5.0)
Alkaline Phosphatase: 52 U/L (ref 38–126)
Anion gap: 10 (ref 5–15)
BUN: 13 mg/dL (ref 6–20)
CO2: 23 mmol/L (ref 22–32)
Calcium: 10.6 mg/dL — ABNORMAL HIGH (ref 8.9–10.3)
Chloride: 100 mmol/L (ref 98–111)
Creatinine, Ser: 0.67 mg/dL (ref 0.44–1.00)
GFR, Estimated: >60 mL/min (ref >60)
Glucose, Bld: 139 mg/dL — ABNORMAL HIGH (ref 70–99)
Potassium: 3.7 mmol/L (ref 3.5–5.1)
Sodium: 133 mmol/L — ABNORMAL LOW (ref 135–145)
Total Bilirubin: 0.4 mg/dL (ref 0.3–1.2)
Total Protein: 8.4 g/dL — ABNORMAL HIGH (ref 6.5–8.1)

## 2022-10-20 LAB — URINALYSIS, ROUTINE W REFLEX MICROSCOPIC
Bilirubin Urine: NEGATIVE
Glucose, UA: NEGATIVE mg/dL
Hgb urine dipstick: NEGATIVE
Ketones, ur: NEGATIVE mg/dL
Leukocytes,Ua: NEGATIVE
Nitrite: NEGATIVE
Protein, ur: NEGATIVE mg/dL
Specific Gravity, Urine: 1.017 (ref 1.005–1.030)
pH: 8 (ref 5.0–8.0)

## 2022-10-20 LAB — CBC
HCT: 38.9 % (ref 36.0–46.0)
Hemoglobin: 13.1 g/dL (ref 12.0–15.0)
MCH: 27.5 pg (ref 26.0–34.0)
MCHC: 33.7 g/dL (ref 30.0–36.0)
MCV: 81.6 fL (ref 80.0–100.0)
Platelets: 423 10*3/uL — ABNORMAL HIGH (ref 150–400)
RBC: 4.77 MIL/uL (ref 3.87–5.11)
RDW: 14.3 % (ref 11.5–15.5)
WBC: 8.8 10*3/uL (ref 4.0–10.5)
nRBC: 0 % (ref 0.0–0.2)

## 2022-10-20 LAB — LIPASE, BLOOD: Lipase: 14 U/L (ref 11–51)

## 2022-10-20 LAB — PREGNANCY, URINE: Preg Test, Ur: NEGATIVE

## 2022-10-20 MED ORDER — SODIUM CHLORIDE 0.9 % IV BOLUS
1000.0000 mL | Freq: Once | INTRAVENOUS | Status: AC
  Administered 2022-10-20: 1000 mL via INTRAVENOUS

## 2022-10-20 MED ORDER — MORPHINE SULFATE (PF) 4 MG/ML IV SOLN
4.0000 mg | Freq: Once | INTRAVENOUS | Status: AC
  Administered 2022-10-20: 4 mg via INTRAVENOUS
  Filled 2022-10-20: qty 1

## 2022-10-20 MED ORDER — ONDANSETRON HCL 4 MG/2ML IJ SOLN
4.0000 mg | Freq: Once | INTRAMUSCULAR | Status: AC
  Administered 2022-10-20: 4 mg via INTRAVENOUS
  Filled 2022-10-20: qty 2

## 2022-10-20 MED ORDER — OMEPRAZOLE 20 MG PO CPDR: 20.0000 mg | DELAYED_RELEASE_CAPSULE | Freq: Every day | ORAL | 0 refills | Status: AC

## 2022-10-20 MED ORDER — KETOROLAC TROMETHAMINE 15 MG/ML IJ SOLN
15.0000 mg | Freq: Once | INTRAMUSCULAR | Status: AC
  Administered 2022-10-20: 15 mg via INTRAVENOUS
  Filled 2022-10-20: qty 1

## 2022-10-20 MED ORDER — IOHEXOL 300 MG/ML  SOLN
100.0000 mL | Freq: Once | INTRAMUSCULAR | Status: AC | PRN
  Administered 2022-10-20: 85 mL via INTRAVENOUS

## 2022-10-20 NOTE — ED Notes (Signed)
Patient transported to Ultrasound 

## 2022-10-20 NOTE — ED Provider Notes (Signed)
[facility] Provider Note  CSN: [id number] Arrival date & time: 10/20/22 1217  Chief Complaint(s) Abdominal Pain  HPI Michaela Thomas is a 47 y.o. female with history of diabetes, hypertension, hyperlipidemia presenting to the emergency department with right lower quadrant abdominal pain.  She reports that the pain began Saturday and has been worsening.  She reports associated nausea.  No fevers or chills.  No vomiting.  No diarrhea.  No urinary symptoms.  No vaginal bleeding or discharge.  She reports it feels similar to an episode last fall where cause was not identified.   Past Medical History Past Medical History:  Diagnosis Date   Back pain    Muscle spasms   Diabetes mellitus without complication ([place])    Hyperlipidemia    Hypertension    Patient Active Problem List   Diagnosis Date Noted   Controlled type 2 diabetes mellitus without complication, without long-term current use of insulin ([place]) 10/11/2022   Essential hypertension 10/11/2022   Smoker 07/17/2012   Family history of breast cancer 07/17/2012   History of abnormal Pap smear 07/17/2012   Home Medication(s) Prior to Admission medications   Medication Sig Start Date End Date Taking? Authorizing Provider  omeprazole (PRILOSEC) 20 MG capsule Take 1 capsule (20 mg total) by mouth daily. 10/20/22 11/19/22 Yes Trifan, Carola Rhine, MD  albuterol (PROVENTIL HFA;VENTOLIN HFA) 108 (90 Base) MCG/ACT inhaler Inhale 2 puffs into the lungs every 6 (six) hours as needed for wheezing or shortness of breath. 12/14/17   Maximino Sarin, FNP  amLODipine (NORVASC) 5 MG tablet  01/01/20   [provider]  cyclobenzaprine (FLEXERIL) 10 MG tablet Take 1 tablet (10 mg total) by mouth 2 (two) times daily as needed for muscle spasms. 04/27/22   Domenic Moras, PA-C  dicyclomine (BENTYL) 20 MG tablet TAKE 1 TABLET (20 MG TOTAL) BY MOUTH 4 (FOUR) TIMES DAILY - BEFORE MEALS AND AT BEDTIME. 10/05/22    Thornton Park, MD  hydrochlorothiazide (HYDRODIURIL) 25 MG tablet TAKE 1 TABLET BY MOUTH EVERY DAY 09/01/22   Billie Ruddy, MD  ibuprofen (ADVIL) 600 MG tablet Take 1 tablet (600 mg total) by mouth every 6 (six) hours as needed. 04/27/22   Domenic Moras, PA-C  Probiotic Product (PROBIOTIC DAILY PO) Take by mouth.    [provider]  rosuvastatin (CRESTOR) 5 MG tablet  01/01/20   [provider]  Semaglutide, 1 MG/DOSE, 4 MG/3ML SOPN Inject 1 mg into the skin once a week. 06/12/22   Billie Ruddy, MD  traMADol (ULTRAM) 50 MG tablet Take 1 tablet (50 mg total) by mouth every 12 (twelve) hours as needed for up to 5 days. 10/19/22 10/24/22  Martinique, Betty G, MD  valACYclovir (VALTREX) 500 MG tablet Take 1 tablet (500 mg total) by mouth 2 (two) times daily as needed. 07/22/22   Billie Ruddy, MD  Vitamin D, Ergocalciferol, (DRISDOL) 50000 units CAPS capsule Take 1 capsule (50,000 Units total) by mouth every 7 (seven) days. 08/17/16   Versie Starks, PA-C  Past Surgical History Past Surgical History:  Procedure Laterality Date   COLPOSCOPY  08/24/1999   ESSURE TUBAL LIGATION     2017   HYSTEROSCOPY WITH D & C Left 12/25/2021   Procedure: DILATATION AND CURETTAGE /HYSTEROSCOPY/DIAGNOSTIC HYSTEROSCOPY UNDER ULTRASOUND GUIDANCE WITH ESSURE REMOVAL;  Surgeon: Sherlyn Hay, DO;  Location: [facility];  Service: Gynecology;  Laterality: Left;  removal of essure in uterus   HYSTEROSCOPY WITH NOVASURE     2018   OPERATIVE ULTRASOUND N/A 12/25/2021   Procedure: OPERATIVE ULTRASOUND;  Surgeon: Sherlyn Hay, DO;  Location: [facility];  Service: Gynecology;  Laterality: N/A;   Family History Family History  Problem Relation Age of Onset   Cancer Mother 10       breast   Breast cancer Mother    Cancer Father         prostate   Hypertension Father    Diabetes Father    Colon cancer Neg Hx    Esophageal cancer Neg Hx    Stomach cancer Neg Hx    Rectal cancer Neg Hx     Social History Social History   Tobacco Use   Smoking status: Every Day    Packs/day: 0.50    Years: 5.00    Total pack years: 2.50    Types: Cigarettes   Smokeless tobacco: Never   Tobacco comments:    pt given phone number to main desk to ask for smoking cessation class info  Vaping Use   Vaping Use: Never used  Substance Use Topics   Alcohol use: Yes    Alcohol/week: 2.0 standard drinks of alcohol    Types: 2 Standard drinks or equivalent per week   Drug use: No   Allergies Patient has no known allergies.  Review of Systems Review of Systems  All other systems reviewed and are negative.   Physical Exam Vital Signs  I have reviewed the triage vital signs BP (!) 121/92   Pulse 78   Temp 98 F (36.7 C) (Oral)   Resp 18   Ht '5\' 4"'$  (1.626 m)   Wt 96.2 kg   SpO2 100%   BMI 36.39 kg/m  Physical Exam Vitals and nursing note reviewed.  Constitutional:      General: She is not in acute distress.    Appearance: She is well-developed.  HENT:     Head: Normocephalic and atraumatic.     Mouth/Throat:     Mouth: Mucous membranes are moist.  Eyes:     Pupils: Pupils are equal, round, and reactive to light.  Cardiovascular:     Rate and Rhythm: Normal rate and regular rhythm.     Heart sounds: No murmur heard. Pulmonary:     Effort: Pulmonary effort is normal. No respiratory distress.     Breath sounds: Normal breath sounds.  Abdominal:     General: Abdomen is flat.     Palpations: Abdomen is soft.     Tenderness: There is abdominal tenderness in the right lower quadrant.  Musculoskeletal:        General: No tenderness.     Right lower leg: No edema.     Left lower leg: No edema.  Skin:    General: Skin is warm and dry.  Neurological:     General: No focal deficit present.     Mental Status: She  is alert. Mental status is at baseline.  Psychiatric:        Mood and  Affect: Mood normal.        Behavior: Behavior normal.     ED Results and Treatments Labs (all labs ordered are listed, but only abnormal results are displayed) Labs Reviewed  COMPREHENSIVE METABOLIC PANEL - Abnormal; Notable for the following components:      Result Value   Sodium 133 (*)    Glucose, Bld 139 (*)    Calcium 10.6 (*)    Total Protein 8.4 (*)    All other components within normal limits  CBC - Abnormal; Notable for the following components:   Platelets 423 (*)    All other components within normal limits  URINALYSIS, ROUTINE W REFLEX MICROSCOPIC - Abnormal; Notable for the following components:   Color, Urine COLORLESS (*)    All other components within normal limits  LIPASE, BLOOD  PREGNANCY, URINE                                                                                                                          Radiology US PELVIC COMPLETE W TRANSVAGINAL AND TORSION R/O  Result Date: 10/20/2022 CLINICAL DATA:  Right lower quadrant and pelvic pain for 5 days. History of uterine fibroids and ablation therapy. EXAM: TRANSABDOMINAL AND TRANSVAGINAL ULTRASOUND OF PELVIS DOPPLER ULTRASOUND OF OVARIES TECHNIQUE: Both transabdominal and transvaginal ultrasound examinations of the pelvis were performed. Transabdominal technique was performed for global imaging of the pelvis including uterus, ovaries, adnexal regions, and pelvic cul-de-sac. It was necessary to proceed with endovaginal exam following the transabdominal exam to visualize the endometrium and ovaries to better advantage. Color and duplex Doppler ultrasound was utilized to evaluate blood flow to the ovaries. COMPARISON:  Abdominopelvic CT same date and 04/27/2022. Pelvic ultrasound 04/27/2022. FINDINGS: Uterus Measurements: 7.6 x 4.3 x 7.6 cm = volume: 129.3 mL. Dominant hypoechoic lesion with low level internal echoes and no internal blood  flow is noted in the right aspect of the myometrium, measuring 3.0 x 4.4 x 3.5 cm, similar to previous ultrasound. Within the left aspect of the uterus, there are probable fibroids measuring 1.6 x 2.2 x 2.0 cm and 2.8 x 3.2 x 2.4 cm. Endometrium Not clearly visualized separate from the myometrial lesions. Right ovary Measurements: 1.6 x 2.4 x 2.1 cm = volume: 4.2 mL. Normal appearance/no adnexal mass. Left ovary Measurements: 2.5 x 1.2 x 2.5 cm = volume: 3.8 mL. Normal appearance/no adnexal mass. Pulsed Doppler evaluation of both ovaries demonstrates normal low-resistance arterial and venous waveforms. Other findings No abnormal free fluid. IMPRESSION: 1. No acute abnormality identified. 2. Uterine fibroids are similar to previous study, including one on the right which demonstrates probable cystic degeneration. 3. Normal appearance of the ovaries. No evidence of ovarian torsion. 4. The endometrium is not well visualized. Electronically Signed   By: Richardean Sale M.D.   On: 10/20/2022 15:22   CT Abdomen Pelvis W Contrast  Result Date: 10/20/2022 CLINICAL DATA:  Abdominal pain. EXAM: CT ABDOMEN AND PELVIS WITH CONTRAST TECHNIQUE: Multidetector CT  imaging of the abdomen and pelvis was performed using the standard protocol following bolus administration of intravenous contrast. RADIATION DOSE REDUCTION: This exam was performed according to the departmental dose-optimization program which includes automated exposure control, adjustment of the mA and/or kV according to patient size and/or use of iterative reconstruction technique. CONTRAST:  63m OMNIPAQUE IOHEXOL 300 MG/ML  SOLN COMPARISON:  04/27/2022. FINDINGS: Lower chest: Minimal subsegmental atelectasis in the dependent lungs. Heart is at the upper limits of normal in size. No pericardial or pleural effusion. Distal esophagus is grossly unremarkable. Hepatobiliary: Liver and gallbladder are unremarkable. No biliary ductal dilatation. Pancreas: Negative.  Spleen: Negative. Adrenals/Urinary Tract: Adrenal glands and kidneys are unremarkable. Ureters are decompressed. Bladder is grossly unremarkable. Stomach/Bowel: Stomach, small bowel, appendix and colon are unremarkable. Vascular/Lymphatic: Vascular structures are unremarkable. No pathologically enlarged lymph nodes. Reproductive: Similar 3.1 cm low-attenuation lesion in the right body of the uterus. Solid-appearing 3.5 cm fibroid in the left uterus. No adnexal mass. Other: No free fluid. No free fluid. Mesenteries and peritoneum are unremarkable. Musculoskeletal: None. IMPRESSION: 1. No acute findings. 2. Uterine fibroids with cystic degeneration on the right. Electronically Signed   By: MLorin PicketM.D.   On: 10/20/2022 13:47    Pertinent labs & imaging results that were available during my care of the patient were reviewed by me and considered in my medical decision making (see MDM for details).  Medications Ordered in ED Medications  morphine (PF) 4 MG/ML injection 4 mg (4 mg Intravenous Given 10/20/22 1344)  ondansetron (ZOFRAN) injection 4 mg (4 mg Intravenous Given 10/20/22 1343)  sodium chloride 0.9 % bolus 1,000 mL (0 mLs Intravenous Stopped 10/20/22 1610)  iohexol (OMNIPAQUE) 300 MG/ML solution 100 mL (85 mLs Intravenous Contrast Given 10/20/22 1331)  ketorolac (TORADOL) 15 MG/ML injection 15 mg (15 mg Intravenous Given 10/20/22 1513)  morphine (PF) 4 MG/ML injection 4 mg (4 mg Intravenous Given 10/20/22 1608)                                                                                                                                     Procedures Procedures  (including critical care time)  Medical Decision Making / ED Course   MDM:  47year old female presenting to the emergency department with abdominal pain.  Patient well-appearing, physical exam with some right lower quadrant tenderness.  Patient reports that this is similar to an episode where she had workup without  identified underlying cause.  Reviewing records, patient had some cystic degeneration of fibroid which is thought to be possibly related.  She followed up with GI and had colonoscopy with no clear cause identified.  Given tenderness and worsening pain, will obtain CT scan to evaluate for intra-abdominal infection such as appendicitis, or other process such as nephrolithiasis, pyelonephritis.  Her urinalysis is not suggestive of urinary infection.  If CT scan unremarkable, will reassess.  Patient still complaining of pain we will  possibly obtain repeat ultrasound to further evaluate.  Clinical Course as of 10/21/22 0705  Wed Oct 20, 2022  1517 US PELVIC COMPLETE W TRANSVAGINAL AND TORSION R/O [WS]  1521 Signed out to Dr. Langston Masker pending pelvic US [WS]    Clinical Course User Index [WS] Cristie Hem, MD     Additional history obtained:  -External records from outside source obtained and reviewed including: Chart review including previous notes, labs, imaging, consultation notes including video visit 10/19/21    Lab Tests: -I ordered, reviewed, and interpreted labs.   The pertinent results include:   Labs Reviewed  COMPREHENSIVE METABOLIC PANEL - Abnormal; Notable for the following components:      Result Value   Sodium 133 (*)    Glucose, Bld 139 (*)    Calcium 10.6 (*)    Total Protein 8.4 (*)    All other components within normal limits  CBC - Abnormal; Notable for the following components:   Platelets 423 (*)    All other components within normal limits  URINALYSIS, ROUTINE W REFLEX MICROSCOPIC - Abnormal; Notable for the following components:   Color, Urine COLORLESS (*)    All other components within normal limits  LIPASE, BLOOD  PREGNANCY, URINE    Notable for no leukocytosis, borderline hyponatremia  Imaging Studies ordered: I ordered imaging studies including CT A/P On my interpretation imaging demonstrates no acute process I independently visualized and  interpreted imaging. I agree with the radiologist interpretation   Medicines ordered and prescription drug management: Meds ordered this encounter  Medications   morphine (PF) 4 MG/ML injection 4 mg   ondansetron (ZOFRAN) injection 4 mg   sodium chloride 0.9 % bolus 1,000 mL   iohexol (OMNIPAQUE) 300 MG/ML solution 100 mL   ketorolac (TORADOL) 15 MG/ML injection 15 mg   morphine (PF) 4 MG/ML injection 4 mg   omeprazole (PRILOSEC) 20 MG capsule    Sig: Take 1 capsule (20 mg total) by mouth daily.    Dispense:  30 capsule    Refill:  0    -I have reviewed the patients home medicines and have made adjustments as needed   Cardiac Monitoring: The patient was maintained on a cardiac monitor.  I personally viewed and interpreted the cardiac monitored which showed an underlying rhythm of: NSR  Social Determinants of Health:  Diagnosis or treatment significantly limited by social determinants of health: obesity   Reevaluation: After the interventions noted above, I reevaluated the patient and found that they have improved  Co morbidities that complicate the patient evaluation  Past Medical History:  Diagnosis Date   Back pain    Muscle spasms   Diabetes mellitus without complication ([place])    Hyperlipidemia    Hypertension       Dispostion: Disposition decision including need for hospitalization was considered, and patient disposition pending at time of sign out.     Final Clinical Impression(s) / ED Diagnoses Final diagnoses:  Right lower quadrant abdominal pain  Uterine leiomyoma, unspecified location     This chart was dictated using voice recognition software.  Despite best efforts to proofread,  errors can occur which can change the documentation meaning.    Cristie Hem, MD 10/21/22 [phone]

## 2022-10-20 NOTE — ED Provider Notes (Signed)
47 yo female here with RLQ abdominal pain No acute findings on CT, uterine fibroids noted Pending pelvic ultrasound Labs unremarkable  Physical Exam  BP (!) 121/92   Pulse 78   Temp 98 F (36.7 C) (Oral)   Resp 20   Ht '5\' 4"'$  (1.626 m)   Wt 96.2 kg   SpO2 100%   BMI 36.39 kg/m   Physical Exam  Procedures  Procedures  ED Course / MDM   Clinical Course as of 10/20/22 1607  Wed Oct 20, 2022  1517 US PELVIC COMPLETE W TRANSVAGINAL AND TORSION R/O [WS]  1521 Signed out to Dr. Langston Masker pending pelvic US [WS]    Clinical Course User Index [WS] Cristie Hem, MD   Medical Decision Making Amount and/or Complexity of Data Reviewed Labs: ordered. Radiology: ordered. Decision-making details documented in ED Course.  Risk Prescription drug management.   Pelvic ultrasound with no emergent findings.  We discussed with the patient that uterine fibroids which may potentially be a cause of her discomfort, especially with the cystic degeneration.  She reports that her pain appears to be reproduced during the transvaginal ultrasound.  I advised that she discuss this with her gynecologist.  She gets these pains periodically which could be consistent with fibroid type pain.  It sounds like she is postmenopausal as her last menstrual cycle was several months ago.  She is ready to go ibuprofen was prescribed tramadol by urgent care and she can continue these medications.  There also appears to be a decent amount of stool in her colon noted on CT and we discussed this that she does suffer from constipation.  She will try bowel cleanout as well.  She is okay for discharge.        Wyvonnia Dusky, MD 10/20/22 [phone]

## 2022-10-20 NOTE — ED Triage Notes (Signed)
Patient here POV from Home.  Endorses RLQ ABD Pain that began Saturday. Worsened since. Constant and Spasms at times.  Some nausea. No emesis or Diarrhea. No Fevers.  NAD Noted during Triage. A&Ox4. GCS 15. Ambulatory.

## 2022-10-21 ENCOUNTER — Telehealth: Payer: Self-pay | Admitting: Gastroenterology

## 2022-10-21 DIAGNOSIS — R102 Pelvic and perineal pain: Secondary | ICD-10-CM | POA: Diagnosis not present

## 2022-10-21 DIAGNOSIS — R110 Nausea: Secondary | ICD-10-CM | POA: Diagnosis not present

## 2022-10-21 DIAGNOSIS — N911 Secondary amenorrhea: Secondary | ICD-10-CM | POA: Diagnosis not present

## 2022-10-21 NOTE — Telephone Encounter (Signed)
Pt responded to via My Chart

## 2022-10-21 NOTE — Telephone Encounter (Signed)
Patient called to follow up on Mychart message that was sent previously. Please advise.

## 2022-10-22 ENCOUNTER — Encounter: Payer: Self-pay | Admitting: Family Medicine

## 2022-10-28 NOTE — Telephone Encounter (Signed)
Pt is calling following up on the below message. Please advise

## 2022-10-29 NOTE — Telephone Encounter (Signed)
Patient is calling again to check on this request for other options, no ozempic, anything but ozempic. Pt says she went to the ED on 10/20/22 with right lower quadrant pain. Requesting a call back to discuss as soon as possible.

## 2022-11-02 NOTE — Telephone Encounter (Signed)
Pt is still waiting on alternative medication and she is aware md not in office today .Marland Kitchen Pt was offered to see another provider and does not know what to do so I transfer her to triage nurse to get advise

## 2022-11-04 NOTE — Telephone Encounter (Signed)
Pt called back again today asking about med alternatives. She has been told over and over again that the messages have been sent but is not receiving a response.   Please advise.

## 2022-11-05 NOTE — Telephone Encounter (Addendum)
Called patient no answer left a VM to inform patient that Dr. Volanda Napoleon would like the patient to be scheduled for an office visit to discuss the below to make sure patient is getting the best care and all needs are met.

## 2022-11-05 NOTE — Telephone Encounter (Signed)
Pt called, returning RN's call from this morning, stating she was at work and could not answer.  Pt sends her apologies, and will call RN back on Monday morning.

## 2022-11-08 NOTE — Telephone Encounter (Signed)
Advised patient that per previous chart note, provider asks patient to schedule OV. Pt declines an OV. Says she doesn't understand why she has to come in for this.

## 2022-11-09 NOTE — Telephone Encounter (Signed)
Called pt back per request and pt is not understanding why she would need another appt concerning the same things she discussed last month. She just need a Rx with new meds. I explained to pt that in order for Dr. To prescribe new Rx she would need to see the Dr.   [name] understood and stated that she works an hour away and it is very hard for her to take off for work; was wondering if she can do a VV instead of OV.   Please advise.

## 2022-11-10 ENCOUNTER — Telehealth (INDEPENDENT_AMBULATORY_CARE_PROVIDER_SITE_OTHER): Payer: Federal, State, Local not specified - PPO | Admitting: Family Medicine

## 2022-11-10 ENCOUNTER — Encounter: Payer: Self-pay | Admitting: Family Medicine

## 2022-11-10 DIAGNOSIS — D219 Benign neoplasm of connective and other soft tissue, unspecified: Secondary | ICD-10-CM | POA: Diagnosis not present

## 2022-11-10 DIAGNOSIS — K5909 Other constipation: Secondary | ICD-10-CM | POA: Diagnosis not present

## 2022-11-10 DIAGNOSIS — E119 Type 2 diabetes mellitus without complications: Secondary | ICD-10-CM

## 2022-11-10 DIAGNOSIS — R1031 Right lower quadrant pain: Secondary | ICD-10-CM

## 2022-11-10 MED ORDER — GLIPIZIDE 5 MG PO TABS: 2.5000 mg | ORAL_TABLET | Freq: Every day | ORAL | 3 refills | Status: DC

## 2022-11-10 NOTE — Progress Notes (Signed)
Virtual Visit via Video Note  I connected with Michaela Thomas  on 11/10/22 at  3:30 PM EDT by a video enabled telemedicine application and verified that I am speaking with the correct person using two identifiers.  Location patient: home Location provider:work or home office Persons participating in the virtual visit: patient, provider  I discussed the limitations of evaluation and management by telemedicine and the availability of in person appointments. The patient expressed understanding and agreed to proceed.   HPI: Pt  is a 47 yo female with pmh sig for chronic abd pain, h/o fibroid, s/p essure and novasure, tobacco use, HTN, DM II, constipation who was seen for f/u.  Pt seen in ED on 10/20/22 for RLQ pain.  Patient notes 3 episodes of RLQ pain with this episode lasting the longest that 2 weeks.  Other episodes typically only a few days.  UA negative for UTI.  Pt told CT abd/pelvis with sig constipation and 3 fibroids.  Pelvic U/S with no acute abnormality, fibroids noted including 1 on the right which demonstrates probable cystic degeneration.  Pt and her OB/Gyn aware of 1 fibroid, but did not feel it was contributing to symptoms.  Has h/o chronic constipation.  Seen by GI, given dicyclomine QID.  Using miralax and sennokot regularly.  Did a modified bowel prep with some relief.  Drinking 32 oz of water per day.  F/u with GI in mid March.  Hgb A1C 6.2% on 04/02/22.  Pt was on Ozempic as rx'd by her previous provider.  When advised it may by contributing to GI issues pt wanted to wean off med.  She recently stopped med after abd pain started.  Pt sent msg stating her blood sugar was extremely high in ED, upon chart review BS was 139 on CMP.  Pt has a home glucometer from her insurance company, but the strips are not working with it.  She has reached out to them to get a new one.  Pt working on diet and exercise.  Hopes to get off medications.  ROS: See pertinent positives and negatives per  HPI.  Past Medical History:  Diagnosis Date   Back pain    Muscle spasms   Diabetes mellitus without complication ([place])    Hyperlipidemia    Hypertension     Past Surgical History:  Procedure Laterality Date   COLPOSCOPY  08/24/1999   ESSURE TUBAL LIGATION     2017   HYSTEROSCOPY WITH D & C Left 12/25/2021   Procedure: DILATATION AND CURETTAGE /HYSTEROSCOPY/DIAGNOSTIC HYSTEROSCOPY UNDER ULTRASOUND GUIDANCE WITH ESSURE REMOVAL;  Surgeon: Sherlyn Hay, DO;  Location: [facility];  Service: Gynecology;  Laterality: Left;  removal of essure in uterus   HYSTEROSCOPY WITH NOVASURE     2018   OPERATIVE ULTRASOUND N/A 12/25/2021   Procedure: OPERATIVE ULTRASOUND;  Surgeon: Sherlyn Hay, DO;  Location: [facility];  Service: Gynecology;  Laterality: N/A;    Family History  Problem Relation Age of Onset   Cancer Mother 8       breast   Breast cancer Mother    Cancer Father        prostate   Hypertension Father    Diabetes Father    Colon cancer Neg Hx    Esophageal cancer Neg Hx    Stomach cancer Neg Hx    Rectal cancer Neg Hx     Current Outpatient Medications:    albuterol (PROVENTIL HFA;VENTOLIN HFA) 108 (90 Base) MCG/ACT  inhaler, Inhale 2 puffs into the lungs every 6 (six) hours as needed for wheezing or shortness of breath., Disp: 1 Inhaler, Rfl: 0   amLODipine (NORVASC) 5 MG tablet, , Disp: , Rfl:    cyclobenzaprine (FLEXERIL) 10 MG tablet, Take 1 tablet (10 mg total) by mouth 2 (two) times daily as needed for muscle spasms., Disp: 20 tablet, Rfl: 0   dicyclomine (BENTYL) 20 MG tablet, TAKE 1 TABLET (20 MG TOTAL) BY MOUTH 4 (FOUR) TIMES DAILY - BEFORE MEALS AND AT BEDTIME., Disp: 360 tablet, Rfl: 1   glipiZIDE (GLUCOTROL) 5 MG tablet, Take 0.5 tablets (2.5 mg total) by mouth daily before breakfast., Disp: 30 tablet, Rfl: 3   hydrochlorothiazide (HYDRODIURIL) 25 MG tablet, TAKE 1 TABLET BY MOUTH EVERY DAY, Disp: 90 tablet, Rfl: 3    ibuprofen (ADVIL) 600 MG tablet, Take 1 tablet (600 mg total) by mouth every 6 (six) hours as needed., Disp: 30 tablet, Rfl: 0   omeprazole (PRILOSEC) 20 MG capsule, Take 1 capsule (20 mg total) by mouth daily., Disp: 30 capsule, Rfl: 0   ondansetron (ZOFRAN-ODT) 4 MG disintegrating tablet, Take 4 mg by mouth as needed., Disp: , Rfl:    Probiotic Product (PROBIOTIC DAILY PO), Take by mouth., Disp: , Rfl:    rosuvastatin (CRESTOR) 5 MG tablet, , Disp: , Rfl:    valACYclovir (VALTREX) 500 MG tablet, Take 1 tablet (500 mg total) by mouth 2 (two) times daily as needed., Disp: 90 tablet, Rfl: 1   Vitamin D, Ergocalciferol, (DRISDOL) 50000 units CAPS capsule, Take 1 capsule (50,000 Units total) by mouth every 7 (seven) days., Disp: 12 capsule, Rfl: 3   Semaglutide, 1 MG/DOSE, 4 MG/3ML SOPN, Inject 1 mg into the skin once a week. (Patient not taking: Reported on 11/10/2022), Disp: 3 mL, Rfl: 2  EXAM:  VITALS per patient if applicable: RR between 123456 bpm  GENERAL: alert, oriented, appears well and in no acute distress  HEENT: atraumatic, conjunctiva clear, no obvious abnormalities on inspection of external nose and ears  NECK: normal movements of the head and neck  LUNGS: on inspection no signs of respiratory distress, breathing rate appears normal, no obvious gross SOB, gasping or wheezing  CV: no obvious cyanosis  MS: moves all visible extremities without noticeable abnormality  PSYCH/NEURO: pleasant and cooperative, no obvious depression or anxiety, speech and thought processing grossly intact  ASSESSMENT AND PLAN:  Discussed the following assessment and plan:  Controlled type 2 diabetes mellitus without complication, without long-term current use of insulin (HCC)  -Hemoglobin A1c 6.2% on 04/02/2022 -Semaglutide 1 mg weekly removed from med list as patient has no longer taking. -Will start glipizide 2.5 mg in a.m. with breakfast -Continue lifestyle modifications -Patient to reach out  to insurance company in regards to replacing glucometer.  If unable to obtain new glucometer will provide prescription -Education provided - Plan: glipiZIDE (GLUCOTROL) 5 MG tablet  Chronic constipation -Likely contributing to abdominal pain. -Patient advised to increase p.o. intake of water (at least 64 ounces per day) and fluids -Continue daily bowel regimen of MiraLAX and sennakot -Consider probiotic -Continue follow-up with GI  Fibroids -3 fibroids noted on CT and ultrasound from ED visit 10/20/2022, with the largest on the right approximately the size of a golf ball.  Concern right fibroid contributing to abdominal pain due to cystic degeneration noted on ultrasound. -As fibroids currently stable continue to monitor.  Consider intervention for increased size, menorrhagia/meta menorrhagia, etc. -Continue follow-up with OB/GYN.  RLQ abdominal  pain -Multifactorial including chronic constipation, cystic degeneration of right fibroid, possible scar tissue from tubal ligation, or medication.  Symptoms less likely from medication as patient has since stopped semaglutide. -CT abdomen pelvis 10/20/2022 reviewed -Continue bowel regimen -Continue follow-up with GI and OB/GYN.   Follow-up in the next 2 to 3 months, sooner if needed.  I discussed the assessment and treatment plan with the patient. The patient was provided an opportunity to ask questions and all were answered. The patient agreed with the plan and demonstrated an understanding of the instructions.   The patient was advised to call back or seek an in-person evaluation if the symptoms worsen or if the condition fails to improve as anticipated.   Billie Ruddy, MD

## 2022-12-02 ENCOUNTER — Ambulatory Visit: Payer: Federal, State, Local not specified - PPO | Admitting: Family Medicine

## 2022-12-02 ENCOUNTER — Encounter: Payer: Self-pay | Admitting: Family Medicine

## 2022-12-02 VITALS — BP 136/90 | HR 83 | Temp 98.4°F | Ht 64.0 in | Wt 215.2 lb

## 2022-12-02 DIAGNOSIS — M545 Low back pain, unspecified: Secondary | ICD-10-CM

## 2022-12-02 DIAGNOSIS — I10 Essential (primary) hypertension: Secondary | ICD-10-CM | POA: Diagnosis not present

## 2022-12-02 DIAGNOSIS — G8929 Other chronic pain: Secondary | ICD-10-CM

## 2022-12-02 DIAGNOSIS — M25561 Pain in right knee: Secondary | ICD-10-CM | POA: Diagnosis not present
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
## 2022-12-13 ENCOUNTER — Other Ambulatory Visit: Payer: Self-pay | Admitting: Family Medicine

## 2022-12-14 ENCOUNTER — Encounter: Payer: Self-pay | Admitting: Family Medicine

## 2022-12-14 MED ORDER — ROSUVASTATIN CALCIUM 5 MG PO TABS: 5.0000 mg | ORAL_TABLET | Freq: Every day | ORAL | 1 refills | Status: DC

## 2022-12-15 DIAGNOSIS — Z1231 Encounter for screening mammogram for malignant neoplasm of breast: Secondary | ICD-10-CM | POA: Diagnosis not present

## 2022-12-15 DIAGNOSIS — Z113 Encounter for screening for infections with a predominantly sexual mode of transmission: Secondary | ICD-10-CM | POA: Diagnosis not present

## 2022-12-15 DIAGNOSIS — Z130 Encounter for screening for diseases of the blood and blood-forming organs and certain disorders involving the immune mechanism: Secondary | ICD-10-CM | POA: Diagnosis not present

## 2022-12-15 DIAGNOSIS — Z01411 Encounter for gynecological examination (general) (routine) with abnormal findings: Secondary | ICD-10-CM | POA: Diagnosis not present

## 2022-12-15 DIAGNOSIS — Z1389 Encounter for screening for other disorder: Secondary | ICD-10-CM | POA: Diagnosis not present

## 2022-12-15 DIAGNOSIS — N898 Other specified noninflammatory disorders of vagina: Secondary | ICD-10-CM | POA: Diagnosis not present

## 2022-12-22 DIAGNOSIS — F17200 Nicotine dependence, unspecified, uncomplicated: Secondary | ICD-10-CM | POA: Diagnosis not present

## 2022-12-22 DIAGNOSIS — M9903 Segmental and somatic dysfunction of lumbar region: Secondary | ICD-10-CM | POA: Diagnosis not present

## 2022-12-22 DIAGNOSIS — K589 Irritable bowel syndrome without diarrhea: Secondary | ICD-10-CM | POA: Insufficient documentation

## 2022-12-22 DIAGNOSIS — Z23 Encounter for immunization: Secondary | ICD-10-CM | POA: Diagnosis not present

## 2022-12-22 DIAGNOSIS — Z1330 Encounter for screening examination for mental health and behavioral disorders, unspecified: Secondary | ICD-10-CM | POA: Diagnosis not present

## 2022-12-22 DIAGNOSIS — M25561 Pain in right knee: Secondary | ICD-10-CM | POA: Diagnosis not present

## 2022-12-22 DIAGNOSIS — M9904 Segmental and somatic dysfunction of sacral region: Secondary | ICD-10-CM | POA: Diagnosis not present

## 2022-12-22 DIAGNOSIS — Z975 Presence of (intrauterine) contraceptive device: Secondary | ICD-10-CM | POA: Insufficient documentation

## 2022-12-22 DIAGNOSIS — K581 Irritable bowel syndrome with constipation: Secondary | ICD-10-CM | POA: Diagnosis not present

## 2022-12-22 DIAGNOSIS — M9901 Segmental and somatic dysfunction of cervical region: Secondary | ICD-10-CM | POA: Diagnosis not present

## 2022-12-22 DIAGNOSIS — A6000 Herpesviral infection of urogenital system, unspecified: Secondary | ICD-10-CM | POA: Insufficient documentation

## 2022-12-22 DIAGNOSIS — I10 Essential (primary) hypertension: Secondary | ICD-10-CM | POA: Diagnosis not present

## 2022-12-22 DIAGNOSIS — E119 Type 2 diabetes mellitus without complications: Secondary | ICD-10-CM | POA: Diagnosis not present

## 2022-12-24 DIAGNOSIS — M25561 Pain in right knee: Secondary | ICD-10-CM | POA: Diagnosis not present

## 2022-12-24 DIAGNOSIS — M9901 Segmental and somatic dysfunction of cervical region: Secondary | ICD-10-CM | POA: Diagnosis not present

## 2022-12-24 DIAGNOSIS — M9903 Segmental and somatic dysfunction of lumbar region: Secondary | ICD-10-CM | POA: Diagnosis not present

## 2022-12-24 DIAGNOSIS — M9904 Segmental and somatic dysfunction of sacral region: Secondary | ICD-10-CM | POA: Diagnosis not present

## 2022-12-29 DIAGNOSIS — M9904 Segmental and somatic dysfunction of sacral region: Secondary | ICD-10-CM | POA: Diagnosis not present

## 2022-12-29 DIAGNOSIS — M25561 Pain in right knee: Secondary | ICD-10-CM | POA: Diagnosis not present

## 2022-12-29 DIAGNOSIS — M9901 Segmental and somatic dysfunction of cervical region: Secondary | ICD-10-CM | POA: Diagnosis not present

## 2022-12-29 DIAGNOSIS — M9903 Segmental and somatic dysfunction of lumbar region: Secondary | ICD-10-CM | POA: Diagnosis not present

## 2022-12-31 DIAGNOSIS — M9901 Segmental and somatic dysfunction of cervical region: Secondary | ICD-10-CM | POA: Diagnosis not present

## 2022-12-31 DIAGNOSIS — M9904 Segmental and somatic dysfunction of sacral region: Secondary | ICD-10-CM | POA: Diagnosis not present

## 2022-12-31 DIAGNOSIS — M25561 Pain in right knee: Secondary | ICD-10-CM | POA: Diagnosis not present

## 2022-12-31 DIAGNOSIS — M9903 Segmental and somatic dysfunction of lumbar region: Secondary | ICD-10-CM | POA: Diagnosis not present

## 2023-01-04 ENCOUNTER — Encounter: Payer: Self-pay | Admitting: Podiatry

## 2023-01-04 ENCOUNTER — Ambulatory Visit: Payer: Federal, State, Local not specified - PPO | Admitting: Podiatry

## 2023-01-04 ENCOUNTER — Ambulatory Visit (INDEPENDENT_AMBULATORY_CARE_PROVIDER_SITE_OTHER): Payer: Federal, State, Local not specified - PPO

## 2023-01-04 DIAGNOSIS — L603 Nail dystrophy: Secondary | ICD-10-CM | POA: Diagnosis not present

## 2023-01-04 DIAGNOSIS — Q666 Other congenital valgus deformities of feet: Secondary | ICD-10-CM

## 2023-01-04 DIAGNOSIS — M2012 Hallux valgus (acquired), left foot: Secondary | ICD-10-CM

## 2023-01-04 DIAGNOSIS — M2010 Hallux valgus (acquired), unspecified foot: Secondary | ICD-10-CM

## 2023-01-04 DIAGNOSIS — M2011 Hallux valgus (acquired), right foot: Secondary | ICD-10-CM | POA: Diagnosis not present

## 2023-01-04 DIAGNOSIS — D2371 Other benign neoplasm of skin of right lower limb, including hip: Secondary | ICD-10-CM

## 2023-01-05 NOTE — Progress Notes (Signed)
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 female presents with the above complaint.   ROS: Denies fever chills nausea vomit muscle aches pains calf pain back pain chest pain shortness of breath.  Past Medical History:  Diagnosis Date   Back pain    Muscle spasms   Diabetes mellitus without complication (HCC)    Hyperlipidemia    Hypertension    Past Surgical History:  Procedure Laterality Date   COLPOSCOPY  08/24/1999   ESSURE TUBAL LIGATION     2017   HYSTEROSCOPY WITH D & C Left 12/25/2021   Procedure: DILATATION AND CURETTAGE /HYSTEROSCOPY/DIAGNOSTIC HYSTEROSCOPY UNDER ULTRASOUND GUIDANCE WITH ESSURE REMOVAL;  Surgeon: Michaela Areola, DO;  Location: [facility];  Service: Gynecology;  Laterality: Left;  removal of essure in uterus   HYSTEROSCOPY WITH NOVASURE     2018   OPERATIVE ULTRASOUND N/A 12/25/2021   Procedure: OPERATIVE ULTRASOUND;  Surgeon: Michaela Areola, DO;  Location: [facility];  Service: Gynecology;  Laterality: N/A;    Current Outpatient Medications:    OZEMPIC, 1 MG/DOSE, 4 MG/3ML SOPN, Inject 1 mg into the skin once a week., Disp: , Rfl:    albuterol (PROVENTIL HFA;VENTOLIN HFA) 108 (90 Base) MCG/ACT inhaler, Inhale 2 puffs into the lungs every 6 (six) hours as needed for wheezing or shortness of breath., Disp: 1 Inhaler, Rfl: 0   amLODipine (NORVASC) 5 MG tablet, , Disp: , Rfl:    cyclobenzaprine (FLEXERIL) 10 MG tablet, Take 1 tablet (10 mg total) by mouth 2 (two) times daily as needed for muscle spasms., Disp: 20 tablet, Rfl: 0   dicyclomine (BENTYL) 20 MG tablet, TAKE 1 TABLET (20 MG  TOTAL) BY MOUTH 4 (FOUR) TIMES DAILY - BEFORE MEALS AND AT BEDTIME., Disp: 360 tablet, Rfl: 1   glipiZIDE (GLUCOTROL) 5 MG tablet, Take 0.5 tablets (2.5 mg total) by mouth daily before breakfast., Disp: 30 tablet, Rfl: 3   hydrochlorothiazide (HYDRODIURIL) 25 MG tablet, TAKE 1 TABLET BY MOUTH EVERY DAY, Disp: 90 tablet, Rfl: 3   ibuprofen (ADVIL) 600 MG tablet, Take 1 tablet (600 mg total) by mouth every 6 (six) hours as needed., Disp: 30 tablet, Rfl: 0   omeprazole (PRILOSEC) 20 MG capsule, Take 1 capsule (20 mg total) by mouth daily., Disp: 30 capsule, Rfl: 0   ondansetron (ZOFRAN-ODT) 4 MG disintegrating tablet, Take 4 mg by mouth as needed., Disp: , Rfl:    Probiotic Product (PROBIOTIC DAILY PO), Take by mouth., Disp: , Rfl:    rosuvastatin (CRESTOR) 5 MG tablet, Take 1 tablet (5 mg total) by mouth daily., Disp: 90 tablet, Rfl: 1   valACYclovir (VALTREX) 500 MG tablet, Take 1 tablet (500 mg total) by mouth 2 (two) times daily as needed., Disp: 90 tablet, Rfl: 1   Vitamin D, Ergocalciferol, (DRISDOL) 50000 units CAPS capsule, Take 1 capsule (50,000 Units total) by mouth every 7 (seven) days., Disp: 12 capsule, Rfl: 3  Allergies  Allergen Reactions   Metformin And Related Diarrhea    Severe diarrhea   Review of Systems Objective:  There  were no vitals filed for this visit.  General: Well developed, nourished, in no acute distress, alert and oriented x3   Dermatological: Skin is warm, dry and supple bilateral. Nails x 10 are well maintained; remaining integument appears unremarkable at this time. There are no open sores, no preulcerative lesions, no rash or signs of infection present.  Thick dystrophic nail second digit right foot most likely secondary to overlapping of the hallux  Vascular: Dorsalis Pedis artery and Posterior Tibial artery pedal pulses are 2/4 bilateral with immedate capillary fill time. Pedal hair growth present. No varicosities and no lower extremity edema present  bilateral.   Neruologic: Grossly intact via light touch bilateral. Vibratory intact via tuning fork bilateral. Protective threshold with Semmes Wienstein monofilament intact to all pedal sites bilateral. Patellar and Achilles deep tendon reflexes 2+ bilateral. No Babinski or clonus noted bilateral.   Musculoskeletal: No gross boney pedal deformities bilateral. No pain, crepitus, or limitation noted with foot and ankle range of motion bilateral. Muscular strength 5/5 in all groups tested bilateral.  She has hallux abductovalgus deformity right foot greater than left with some overlapping of the right hallux onto the second digit.  Mallet toe deformity noted to the second digit of the right foot.  Limited range of motion.  Pes planovalgus is noted bilaterally.  Gait: Unassisted, Nonantalgic.    Radiographs:  Radiographs taken today demonstrate osseously mature individual with good bone mineralization.  She has an increase in the first intermetatarsal angle with a hallux abductus angle greater than normal value.  The right first metatarsal phalangeal joint appears to be dislocating.  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  She currently does not want to go that route we did discuss possible injection therapy for tenderness she understands this is amenable to we will follow-up with [place] on an as-needed basis.     Michaela Thomas, [place]

## 2023-01-07 ENCOUNTER — Other Ambulatory Visit: Payer: Self-pay | Admitting: Family Medicine

## 2023-01-07 DIAGNOSIS — E119 Type 2 diabetes mellitus without complications: Secondary | ICD-10-CM

## 2023-01-10 ENCOUNTER — Emergency Department (HOSPITAL_BASED_OUTPATIENT_CLINIC_OR_DEPARTMENT_OTHER): Payer: Federal, State, Local not specified - PPO

## 2023-01-10 ENCOUNTER — Other Ambulatory Visit (HOSPITAL_BASED_OUTPATIENT_CLINIC_OR_DEPARTMENT_OTHER): Payer: Self-pay

## 2023-01-10 ENCOUNTER — Encounter (HOSPITAL_BASED_OUTPATIENT_CLINIC_OR_DEPARTMENT_OTHER): Payer: Self-pay

## 2023-01-10 ENCOUNTER — Other Ambulatory Visit: Payer: Self-pay

## 2023-01-10 ENCOUNTER — Emergency Department (HOSPITAL_BASED_OUTPATIENT_CLINIC_OR_DEPARTMENT_OTHER)
Admission: EM | Admit: 2023-01-10 | Discharge: 2023-01-10 | Disposition: A | Discharge status: Home / Self Care | Payer: Federal, State, Local not specified - PPO | Attending: Emergency Medicine | Admitting: Emergency Medicine

## 2023-01-10 DIAGNOSIS — E119 Type 2 diabetes mellitus without complications: Secondary | ICD-10-CM | POA: Diagnosis not present

## 2023-01-10 DIAGNOSIS — R110 Nausea: Secondary | ICD-10-CM | POA: Diagnosis not present

## 2023-01-10 DIAGNOSIS — R112 Nausea with vomiting, unspecified: Secondary | ICD-10-CM | POA: Diagnosis not present

## 2023-01-10 DIAGNOSIS — D75839 Thrombocytosis, unspecified: Secondary | ICD-10-CM | POA: Insufficient documentation

## 2023-01-10 DIAGNOSIS — F419 Anxiety disorder, unspecified: Secondary | ICD-10-CM | POA: Diagnosis not present

## 2023-01-10 DIAGNOSIS — D259 Leiomyoma of uterus, unspecified: Secondary | ICD-10-CM | POA: Insufficient documentation

## 2023-01-10 DIAGNOSIS — Z79899 Other long term (current) drug therapy: Secondary | ICD-10-CM | POA: Diagnosis not present

## 2023-01-10 DIAGNOSIS — I10 Essential (primary) hypertension: Secondary | ICD-10-CM | POA: Insufficient documentation

## 2023-01-10 DIAGNOSIS — R1031 Right lower quadrant pain: Secondary | ICD-10-CM

## 2023-01-10 DIAGNOSIS — Z7984 Long term (current) use of oral hypoglycemic drugs: Secondary | ICD-10-CM | POA: Insufficient documentation

## 2023-01-10 LAB — COMPREHENSIVE METABOLIC PANEL
ALT: 14 U/L (ref 0–44)
AST: 14 U/L — ABNORMAL LOW (ref 15–41)
Albumin: 4.5 g/dL (ref 3.5–5.0)
Alkaline Phosphatase: 46 U/L (ref 38–126)
Anion gap: 8 (ref 5–15)
BUN: 12 mg/dL (ref 6–20)
CO2: 23 mmol/L (ref 22–32)
Calcium: 10.1 mg/dL (ref 8.9–10.3)
Chloride: 105 mmol/L (ref 98–111)
Creatinine, Ser: 0.73 mg/dL (ref 0.44–1.00)
GFR, Estimated: >60 mL/min (ref >60)
Glucose, Bld: 104 mg/dL — ABNORMAL HIGH (ref 70–99)
Potassium: 4.1 mmol/L (ref 3.5–5.1)
Sodium: 136 mmol/L (ref 135–145)
Total Bilirubin: 0.3 mg/dL (ref 0.3–1.2)
Total Protein: 7.7 g/dL (ref 6.5–8.1)

## 2023-01-10 LAB — LIPASE, BLOOD: Lipase: 19 U/L (ref 11–51)

## 2023-01-10 LAB — CBC
HCT: 37.8 % (ref 36.0–46.0)
Hemoglobin: 12.6 g/dL (ref 12.0–15.0)
MCH: 27.5 pg (ref 26.0–34.0)
MCHC: 33.3 g/dL (ref 30.0–36.0)
MCV: 82.4 fL (ref 80.0–100.0)
Platelets: 426 10*3/uL — ABNORMAL HIGH (ref 150–400)
RBC: 4.59 MIL/uL (ref 3.87–5.11)
RDW: 14.4 % (ref 11.5–15.5)
WBC: 9.5 10*3/uL (ref 4.0–10.5)
nRBC: 0 % (ref 0.0–0.2)

## 2023-01-10 LAB — URINALYSIS, ROUTINE W REFLEX MICROSCOPIC
Bilirubin Urine: NEGATIVE
Glucose, UA: NEGATIVE mg/dL
Hgb urine dipstick: NEGATIVE
Ketones, ur: NEGATIVE mg/dL
Leukocytes,Ua: NEGATIVE
Nitrite: NEGATIVE
Protein, ur: NEGATIVE mg/dL
Specific Gravity, Urine: 1.035 — ABNORMAL HIGH (ref 1.005–1.030)
pH: 5.5 (ref 5.0–8.0)

## 2023-01-10 LAB — HCG, SERUM, QUALITATIVE: Preg, Serum: NEGATIVE

## 2023-01-10 MED ORDER — DICYCLOMINE HCL 20 MG PO TABS
20.0000 mg | ORAL_TABLET | Freq: Three times a day (TID) | ORAL | 0 refills | Status: DC | PRN
  Filled 2023-01-10: qty 15, 5d supply, fill #0

## 2023-01-10 MED ORDER — HYDROMORPHONE HCL 1 MG/ML IJ SOLN
1.0000 mg | Freq: Once | INTRAMUSCULAR | Status: AC
  Administered 2023-01-10: 1 mg via INTRAVENOUS
  Filled 2023-01-10: qty 1

## 2023-01-10 MED ORDER — IOHEXOL 300 MG/ML  SOLN
100.0000 mL | Freq: Once | INTRAMUSCULAR | Status: AC | PRN
  Administered 2023-01-10: 85 mL via INTRAVENOUS

## 2023-01-10 MED ORDER — MORPHINE SULFATE (PF) 4 MG/ML IV SOLN
4.0000 mg | Freq: Once | INTRAVENOUS | Status: AC
  Administered 2023-01-10: 4 mg via INTRAVENOUS
  Filled 2023-01-10: qty 1

## 2023-01-10 MED ORDER — ONDANSETRON HCL 4 MG PO TABS
4.0000 mg | ORAL_TABLET | Freq: Three times a day (TID) | ORAL | 0 refills | Status: AC | PRN
  Filled 2023-01-10: qty 12, 4d supply, fill #0

## 2023-01-10 MED ORDER — LACTATED RINGERS IV BOLUS
500.0000 mL | Freq: Once | INTRAVENOUS | Status: AC
  Administered 2023-01-10: 500 mL via INTRAVENOUS

## 2023-01-10 MED ORDER — ONDANSETRON HCL 4 MG/2ML IJ SOLN
4.0000 mg | Freq: Once | INTRAMUSCULAR | Status: AC
  Administered 2023-01-10: 4 mg via INTRAVENOUS
  Filled 2023-01-10: qty 2

## 2023-01-10 MED ORDER — DICYCLOMINE HCL 10 MG/ML IM SOLN
20.0000 mg | Freq: Once | INTRAMUSCULAR | Status: AC
  Administered 2023-01-10: 20 mg via INTRAMUSCULAR
  Filled 2023-01-10: qty 2

## 2023-01-10 NOTE — ED Notes (Signed)
Pt ambulated to restroom. 

## 2023-01-10 NOTE — ED Triage Notes (Addendum)
Patient here POV from Home.  Endorses ABD Pain that began Wednesday. Not terribly severe initially but has worsened since, especially this AM. RLQ.   Some nausea. No Emesis or Diarrhea. Had laxative use with movement of bowels but no relief of pain.   Uncomfortable and Tearful during Triage. A&Ox4. GCS 15. Ambulatory.

## 2023-01-10 NOTE — ED Notes (Signed)
Spoke with pt after pharm tech Selena Batten stated that pt was expecting a RX for pain meds. Verified with PA Mariah and updated pt that pain meds were not being prescribed. Pt acknowledges understanding although pt endorses being upset.

## 2023-01-10 NOTE — ED Provider Notes (Signed)
[place] EMERGENCY DEPARTMENT AT [facility] Provider Note   CSN: [id number] Arrival date & time: 01/10/23  1133     History  Chief Complaint  Patient presents with   Abdominal Pain    Michaela Thomas is a 47 y.o. female with past medical history hypertension, hyperlipidemia, diabetes who presents to the ED complaining of severe abdominal pain.  She states that she has had severe right lower quadrant abdominal pain for the last 5 days.  It was initially tolerable but has become worse.  Associated nausea but no vomiting, diarrhea, fever, chills, chest pain, shortness of breath, vaginal bleeding or discharge, dysuria or hematuria, back pain, or other complaints.  Notes history of recurrent similar symptoms and has been told that she has IBS but is unsure if this is actually what is going on.  Last bowel movement was this morning.  Also has a history of a small uterine fibroid but has never had complications from this.      Home Medications Prior to Admission medications   Medication Sig Start Date End Date Taking? Authorizing Provider  dicyclomine (BENTYL) 20 MG tablet Take 1 tablet (20 mg total) by mouth 3 (three) times daily as needed for up to 5 days for spasms. 01/10/23 01/15/23 Yes Najwa Spillane L, PA-C  ondansetron (ZOFRAN) 4 MG tablet Take 1 tablet (4 mg total) by mouth every 8 (eight) hours as needed for up to 5 days for nausea or vomiting. 01/10/23 01/15/23 Yes Pascale Maves L, PA-C  albuterol (PROVENTIL HFA;VENTOLIN HFA) 108 (90 Base) MCG/ACT inhaler Inhale 2 puffs into the lungs every 6 (six) hours as needed for wheezing or shortness of breath. 12/14/17   Ralene Muskrat, FNP  amLODipine (NORVASC) 5 MG tablet  01/01/20   [provider]  cyclobenzaprine (FLEXERIL) 10 MG tablet Take 1 tablet (10 mg total) by mouth 2 (two) times daily as needed for muscle spasms. 04/27/22   Fayrene Helper, PA-C  glipiZIDE (GLUCOTROL) 5 MG tablet Take 0.5 tablets (2.5 mg total) by  mouth daily before breakfast. 11/10/22   Deeann Saint, MD  hydrochlorothiazide (HYDRODIURIL) 25 MG tablet TAKE 1 TABLET BY MOUTH EVERY DAY 09/01/22   Deeann Saint, MD  ibuprofen (ADVIL) 600 MG tablet Take 1 tablet (600 mg total) by mouth every 6 (six) hours as needed. 04/27/22   Fayrene Helper, PA-C  omeprazole (PRILOSEC) 20 MG capsule Take 1 capsule (20 mg total) by mouth daily. 10/20/22 11/19/22  Terald Sleeper, MD  OZEMPIC, 1 MG/DOSE, 4 MG/3ML SOPN Inject 1 mg into the skin once a week. 12/22/22   [provider]  Probiotic Product (PROBIOTIC DAILY PO) Take by mouth.    [provider]  rosuvastatin (CRESTOR) 5 MG tablet Take 1 tablet (5 mg total) by mouth daily. 12/14/22   Deeann Saint, MD  valACYclovir (VALTREX) 500 MG tablet Take 1 tablet (500 mg total) by mouth 2 (two) times daily as needed. 07/22/22   Deeann Saint, MD  Vitamin D, Ergocalciferol, (DRISDOL) 50000 units CAPS capsule Take 1 capsule (50,000 Units total) by mouth every 7 (seven) days. 08/17/16   Faythe Ghee, PA-C      Allergies    Codeine and Metformin and related    Review of Systems   Review of Systems  All other systems reviewed and are negative.   Physical Exam Updated Vital Signs BP (!) 136/97 (BP Location: Right Arm)   Pulse 69   Temp 98.9 F (37.2 C) (  Oral)   Resp 15   Ht 5\' 4"  (1.626 m)   Wt 94.3 kg   SpO2 99%   BMI 35.70 kg/m  Physical Exam Vitals and nursing note reviewed.  Constitutional:      General: She is in acute distress (mild secondary to pain).     Appearance: Normal appearance. She is not ill-appearing, toxic-appearing or diaphoretic.  HENT:     Head: Normocephalic and atraumatic.     Mouth/Throat:     Mouth: Mucous membranes are moist.  Eyes:     General: No scleral icterus.    Extraocular Movements: Extraocular movements intact.     Conjunctiva/sclera: Conjunctivae normal.  Cardiovascular:     Rate and Rhythm: Normal rate and regular rhythm.     Heart  sounds: No murmur heard. Pulmonary:     Effort: Pulmonary effort is normal. No respiratory distress.     Breath sounds: Normal breath sounds. No stridor. No wheezing, rhonchi or rales.  Chest:     Chest wall: No tenderness.  Abdominal:     General: Abdomen is flat.     Palpations: Abdomen is soft. There is no shifting dullness, fluid wave, hepatomegaly, splenomegaly or pulsatile mass.     Tenderness: There is abdominal tenderness in the right upper quadrant and right lower quadrant. There is guarding (voluntary). There is no right CVA tenderness, left CVA tenderness or rebound. Negative signs include Murphy's sign and McBurney's sign.  Musculoskeletal:        General: Normal range of motion.     Cervical back: Neck supple.     Right lower leg: No edema.     Left lower leg: No edema.  Skin:    General: Skin is warm and dry.     Capillary Refill: Capillary refill takes less than 2 seconds.     Findings: No rash.  Neurological:     Mental Status: She is alert. Mental status is at baseline.  Psychiatric:        Mood and Affect: Mood is anxious. Affect is tearful.        Speech: Speech normal.        Behavior: Behavior is cooperative.     ED Results / Procedures / Treatments   Labs (all labs ordered are listed, but only abnormal results are displayed) Labs Reviewed  COMPREHENSIVE METABOLIC PANEL - Abnormal; Notable for the following components:      Result Value   Glucose, Bld 104 (*)    AST 14 (*)    All other components within normal limits  CBC - Abnormal; Notable for the following components:   Platelets 426 (*)    All other components within normal limits  URINALYSIS, ROUTINE W REFLEX MICROSCOPIC - Abnormal; Notable for the following components:   Color, Urine COLORLESS (*)    Specific Gravity, Urine 1.035 (*)    All other components within normal limits  LIPASE, BLOOD  HCG, SERUM, QUALITATIVE    EKG EKG Interpretation  Date/Time:  Monday Jan 10 2023 15:22:09  EDT Ventricular Rate:  62 PR Interval:  149 QRS Duration: 95 QT Interval:  408 QTC Calculation: 415 R Axis:   54 Text Interpretation: Sinus rhythm Probable left ventricular hypertrophy Borderline T abnormalities, inferior leads No previous ECGs available Confirmed by Vivien Rossetti ([id number]) on 01/10/2023 3:23:31 PM  Radiology US PELVIC COMPLETE WITH TRANSVAGINAL  Result Date: 01/10/2023 CLINICAL DATA:  Follow-up of CT findings.  History of ablation. EXAM: TRANSABDOMINAL AND TRANSVAGINAL ULTRASOUND OF PELVIS TECHNIQUE:  Both transabdominal and transvaginal ultrasound examinations of the pelvis were performed. Transabdominal technique was performed for global imaging of the pelvis including uterus, ovaries, adnexal regions, and pelvic cul-de-sac. It was necessary to proceed with endovaginal exam following the transabdominal exam to visualize the uterus. COMPARISON:  CT dated Jan 18, 2023, pelvic ultrasound October 20, 2022 FINDINGS: Uterus Measurements: 8.4 x 4.6 x 5.9 cm = volume: 120.7 mL. Two myometrial masses are seen measuring 3.3 x 3.2 x 2.0 cm within the left anterior uterine body. A second smaller mass is seen in the lower segment of the anterior left uterine body measuring 1.4 x 1.8 x 1.7 cm. Nabothian cysts are seen. Endometrium Thickness: 7.3 mm where visualized. The endometrium is incompletely visualize due to presence of myometrial masses. Right ovary Measurements: 3.0 x 1.8 x 1.8 cm = volume: 5.2 mL. Normal appearance/no adnexal mass. Left ovary Measurements: 2.6 x 1.0 x 1.1 cm = volume: 1.4 mL. Normal appearance/no adnexal mass. Other findings Trace free fluid. IMPRESSION: Two myometrial masses within the left anterior uterine body. The larger mass is stable to decreased in size when compared to the prior ultrasound. The smaller mass was not seen on the prior ultrasound. Incomplete visualization of the endometrium due to presence of myometrial masses. Normal ovaries. Electronically Signed    By: Ted Mcalpine M.D.   On: 01/10/2023 15:17   CT ABDOMEN PELVIS W CONTRAST  Result Date: 01/10/2023 CLINICAL DATA:  RIGHT LOWER QUADRANT abdominal pain. History of bilateral tubal ligation. EXAM: CT ABDOMEN AND PELVIS WITH CONTRAST TECHNIQUE: Multidetector CT imaging of the abdomen and pelvis was performed using the standard protocol following bolus administration of intravenous contrast. RADIATION DOSE REDUCTION: This exam was performed according to the departmental dose-optimization program which includes automated exposure control, adjustment of the mA and/or kV according to patient size and/or use of iterative reconstruction technique. CONTRAST:  85mL OMNIPAQUE IOHEXOL 300 MG/ML  SOLN COMPARISON:  10/20/2022 FINDINGS: Lower chest: No acute abnormality. Hepatobiliary: No focal liver abnormality is seen. No radiopaque gallstones, biliary dilatation, or pericholecystic inflammatory changes. Pancreas: Unremarkable. No pancreatic ductal dilatation or surrounding inflammatory changes. Spleen: Normal in size without focal abnormality. Adrenals/Urinary Tract: Adrenal glands are normal. Kidneys are symmetric in size and enhancement. No renal mass. No hydronephrosis. Ureters are unremarkable. The bladder and visualized portion of the urethra are normal. Stomach/Bowel: Stomach and small bowel loops are normal in appearance. The appendix is well seen and normal in appearance. Large bowel loops are normal. Vascular/Lymphatic: No significant vascular findings are present. No enlarged abdominal or pelvic lymph nodes. Reproductive: Uterus is enlarged. Within the LEFT aspect of the uterus, low-attenuation lesion is similar to prior studies, measuring 3.0 centimeters. Within the RIGHT aspect of the uterus, a 3.5 centimeter low-attenuation lesion is consistent with previously identified fluid-filled lesion. RIGHT Essure device is present. Cervical nabothian cysts are present. Adnexal regions are unremarkable. Other:  Abdominal wall is unremarkable.  Trace amount of ascites. Musculoskeletal: No acute or significant osseous findings. IMPRESSION: 1. No evidence for acute abnormality of the abdomen or pelvis. 2. Normal appendix. 3. Enlarged uterus. Stable low-attenuation lesions in the uterus, possibly representing fibroids and/or cystic degeneration of fibroids. Recommend follow-up pelvic ultrasound to assess stability. 4. No adnexal mass. Electronically Signed   By: Norva Pavlov M.D.   On: 01/10/2023 13:09    Procedures Procedures    Medications Ordered in ED Medications  morphine (PF) 4 MG/ML injection 4 mg (4 mg Intravenous Given 01/10/23 1234)  ondansetron (ZOFRAN) injection  4 mg (4 mg Intravenous Given 01/10/23 1234)  dicyclomine (BENTYL) injection 20 mg (20 mg Intramuscular Given 01/10/23 1235)  lactated ringers bolus 500 mL (0 mLs Intravenous Stopped 01/10/23 1340)  iohexol (OMNIPAQUE) 300 MG/ML solution 100 mL (85 mLs Intravenous Contrast Given 01/10/23 1241)  morphine (PF) 4 MG/ML injection 4 mg (4 mg Intravenous Given 01/10/23 1338)  HYDROmorphone (DILAUDID) injection 1 mg (1 mg Intravenous Given 01/10/23 1601)    ED Course/ Medical Decision Making/ A&P                             Medical Decision Making Amount and/or Complexity of Data Reviewed Labs: ordered. Decision-making details documented in ED Course. Radiology: ordered. Decision-making details documented in ED Course. ECG/medicine tests: ordered. Decision-making details documented in ED Course.  Risk Prescription drug management.   Medical Decision Making:   Tyresha Badenhop is a 47 y.o. female who presented to the ED today with abdominal pain detailed above.    Additional history discussed with patient's family/caregivers.  Patient's presentation is complicated by their history of DM, HTN, HLD.  Patient placed on continuous vitals and telemetry monitoring while in ED which was reviewed periodically.  Complete initial physical exam  performed, notably the patient  was in mild distress secondary to pain.  Anxious and tearful.  Abdomen soft.  No rebound.  Voluntary guarding.  No CVA tenderness.    Reviewed and confirmed nursing documentation for past medical history, family history, social history.    Initial Assessment:   With the patient's presentation of abdominal pain, differential diagnosis includes but is not limited to AAA, mesenteric ischemia, appendicitis, diverticulitis, DKA, gastritis, gastroenteritis, AMI, nephrolithiasis, pancreatitis, peritonitis, adrenal insufficiency, intestinal ischemia, constipation, UTI, SBO/LBO, splenic rupture, biliary disease, IBD, IBS, PUD, hepatitis, STD, ovarian/testicular torsion, electrolyte disturbance, DKA, dehydration, acute kidney injury, renal failure, cholecystitis, cholelithiasis, choledocholithiasis, abdominal pain of  unknown etiology.    Initial Plan:  Screening labs including CBC and Metabolic panel to evaluate for infectious or metabolic etiology of disease.  Lipase to evaluate for pancreatitis Urinalysis with reflex culture ordered to evaluate for UTI or relevant urologic/nephrologic pathology.  CT abd/pelvis to evaluate for intra-abdominal pathology EKG to evaluate for cardiac pathology Symptomatic management Objective evaluation as reviewed   Initial Study Results:   Laboratory  All laboratory results reviewed without evidence of clinically relevant pathology.   Exceptions include: Platelets 426  EKG EKG was reviewed independently. ST segments without concerns for elevations.   EKG: normal sinus rhythm.   Radiology:  All images reviewed independently. Agree with radiology report at this time.   US PELVIC COMPLETE WITH TRANSVAGINAL  Result Date: 01/10/2023 CLINICAL DATA:  Follow-up of CT findings.  History of ablation. EXAM: TRANSABDOMINAL AND TRANSVAGINAL ULTRASOUND OF PELVIS TECHNIQUE: Both transabdominal and transvaginal ultrasound examinations of the pelvis  were performed. Transabdominal technique was performed for global imaging of the pelvis including uterus, ovaries, adnexal regions, and pelvic cul-de-sac. It was necessary to proceed with endovaginal exam following the transabdominal exam to visualize the uterus. COMPARISON:  CT dated Jan 18, 2023, pelvic ultrasound October 20, 2022 FINDINGS: Uterus Measurements: 8.4 x 4.6 x 5.9 cm = volume: 120.7 mL. Two myometrial masses are seen measuring 3.3 x 3.2 x 2.0 cm within the left anterior uterine body. A second smaller mass is seen in the lower segment of the anterior left uterine body measuring 1.4 x 1.8 x 1.7 cm. Nabothian cysts are seen. Endometrium Thickness: 7.3  mm where visualized. The endometrium is incompletely visualize due to presence of myometrial masses. Right ovary Measurements: 3.0 x 1.8 x 1.8 cm = volume: 5.2 mL. Normal appearance/no adnexal mass. Left ovary Measurements: 2.6 x 1.0 x 1.1 cm = volume: 1.4 mL. Normal appearance/no adnexal mass. Other findings Trace free fluid. IMPRESSION: Two myometrial masses within the left anterior uterine body. The larger mass is stable to decreased in size when compared to the prior ultrasound. The smaller mass was not seen on the prior ultrasound. Incomplete visualization of the endometrium due to presence of myometrial masses. Normal ovaries. Electronically Signed   By: Ted Mcalpine M.D.   On: 01/10/2023 15:17   CT ABDOMEN PELVIS W CONTRAST  Result Date: 01/10/2023 CLINICAL DATA:  RIGHT LOWER QUADRANT abdominal pain. History of bilateral tubal ligation. EXAM: CT ABDOMEN AND PELVIS WITH CONTRAST TECHNIQUE: Multidetector CT imaging of the abdomen and pelvis was performed using the standard protocol following bolus administration of intravenous contrast. RADIATION DOSE REDUCTION: This exam was performed according to the departmental dose-optimization program which includes automated exposure control, adjustment of the mA and/or kV according to patient size  and/or use of iterative reconstruction technique. CONTRAST:  85mL OMNIPAQUE IOHEXOL 300 MG/ML  SOLN COMPARISON:  10/20/2022 FINDINGS: Lower chest: No acute abnormality. Hepatobiliary: No focal liver abnormality is seen. No radiopaque gallstones, biliary dilatation, or pericholecystic inflammatory changes. Pancreas: Unremarkable. No pancreatic ductal dilatation or surrounding inflammatory changes. Spleen: Normal in size without focal abnormality. Adrenals/Urinary Tract: Adrenal glands are normal. Kidneys are symmetric in size and enhancement. No renal mass. No hydronephrosis. Ureters are unremarkable. The bladder and visualized portion of the urethra are normal. Stomach/Bowel: Stomach and small bowel loops are normal in appearance. The appendix is well seen and normal in appearance. Large bowel loops are normal. Vascular/Lymphatic: No significant vascular findings are present. No enlarged abdominal or pelvic lymph nodes. Reproductive: Uterus is enlarged. Within the LEFT aspect of the uterus, low-attenuation lesion is similar to prior studies, measuring 3.0 centimeters. Within the RIGHT aspect of the uterus, a 3.5 centimeter low-attenuation lesion is consistent with previously identified fluid-filled lesion. RIGHT Essure device is present. Cervical nabothian cysts are present. Adnexal regions are unremarkable. Other: Abdominal wall is unremarkable.  Trace amount of ascites. Musculoskeletal: No acute or significant osseous findings. IMPRESSION: 1. No evidence for acute abnormality of the abdomen or pelvis. 2. Normal appendix. 3. Enlarged uterus. Stable low-attenuation lesions in the uterus, possibly representing fibroids and/or cystic degeneration of fibroids. Recommend follow-up pelvic ultrasound to assess stability. 4. No adnexal mass. Electronically Signed   By: Norva Pavlov M.D.   On: 01/10/2023 13:09   DG Foot Complete Right  Result Date: 01/04/2023 Please see detailed radiograph report in office  note.  DG Foot Complete Left  Result Date: 01/04/2023 Please see detailed radiograph report in office note.     Final Assessment and Plan:   48 year old female presents to the ED for evaluation of abdominal pain.  Somewhat chronic history of recurring abdominal pain.  She endorses right lower quadrant pain and is tender in this location as well as right upper quadrant with voluntary guarding.  Abdomen soft, nondistended, no CVA tenderness.  She is very anxious and tearful expressing concern for symptoms without known etiology.  States that was previously seen by GI and had a colonoscopy in the fall 2023 which showed precancerous polyps which were removed.  Also told by GI that she had IBS and given Bentyl which she has been taking daily without  relief.  No associated vomiting or diarrhea.  No fever.  No vaginal bleeding or discharge.  Has also been told before that she has a small uterine fibroid but was last evaluated by OB/GYN in March of this year and they decided no intervention is needed at this time.  Workup initiated as above for further assessment.  Somewhat difficult to establish good pain control despite multiple medications given in the ED.  Pain medication had to be reduced multiple times.  No significant electrolyte disturbance, no AKI, unremarkable liver function.  No leukocytosis, normal hemoglobin.  Negative pregnancy test.  UA negative.  CT abdomen pelvis showed enlarged uterus likely secondary to uterine fibroids but difficult to characterize on CT.  Recommendation for pelvic ultrasound for follow-up which was ordered.  Patient agreeable with this plan.  Ultrasound showed myometrial masses but no other acute findings.  Discussed all findings in detail with patient and family member at bedside.  Patient expressed frustration regarding lack of diagnosis despite recurrent abdominal pain and being evaluated by multiple providers in the past.  Acknowledged patient's frustrations and offered  additional pain relief before discharge home.  Will provide with antiemetics for home as well as continued Bentyl.  She does have follow-up scheduled with GI on Thursday of this week.  Encourage patient to keep this follow-up and discuss better long-term management of her chronic abdominal pain.  Patient requesting additional pain control for home.  No indication at this time for opiate pain medication in the setting of abdominal pain and with patient expressing irregular bowel movements we will hold off on this.  Also provided with OB/GYN follow-up for further evaluation of fibroids.  Patient stressed understanding of plan.  Strict ED return precautions given, all questions answered, and stable for discharge.   Clinical Impression:  1. Right lower quadrant abdominal pain   2. Uterine leiomyoma, unspecified location   3. Nausea      Discharge           Final Clinical Impression(s) / ED Diagnoses Final diagnoses:  Uterine leiomyoma, unspecified location  Right lower quadrant abdominal pain  Nausea    Rx / DC Orders ED Discharge Orders          Ordered    dicyclomine (BENTYL) 20 MG tablet  3 times daily PRN        01/10/23 1541    ondansetron (ZOFRAN) 4 MG tablet  Every 8 hours PRN        01/10/23 1541              Tonette Lederer, PA-C 01/10/23 1638    Benjiman Core, MD 01/11/23 [phone]

## 2023-01-10 NOTE — ED Notes (Signed)
Pt given discharge instructions and reviewed prescriptions. Opportunities given for questions. Pt verbalizes understanding. PIV removed x1. Krista Godsil R, RN 

## 2023-01-10 NOTE — Discharge Instructions (Addendum)
Thank you for letting us take care of you today.  Overall, your workup was reassuring including your blood work, urine, and imaging.  We did see some masses as discussed in the myometrium layer of the uterus.  These appear to be uterine fibroids.  They could be contributing to your pain.  With this, I recommend that you follow-up with OB/GYN.  I provided information for follow-up with [facility] or you may follow-up with an OB/GYN of your own choosing.  With your history of recurring abdominal pain, I do believe that he should follow-up with GI as well.  I know you have seen GI in the past and you may see your previous GI if you would like or I have given you information for [facility] as well.  They may want to do additional testing that we cannot do in the ER or manage you have a different medications to better control your abdominal pain.  I sent in a prescription for medication to help with abdominal pain and the medication to help with nausea.  Please take these only as prescribed.  You may also take over-the-counter medication as needed with these prescriptions.  For any new or worsening symptoms, please return to the nearest emergency department for reevaluation.

## 2023-01-13 ENCOUNTER — Ambulatory Visit: Payer: Federal, State, Local not specified - PPO | Admitting: Gastroenterology

## 2023-01-13 ENCOUNTER — Encounter: Payer: Self-pay | Admitting: Gastroenterology

## 2023-01-13 VITALS — BP 110/78 | HR 72 | Ht 63.0 in | Wt 206.5 lb

## 2023-01-13 DIAGNOSIS — R1031 Right lower quadrant pain: Secondary | ICD-10-CM | POA: Diagnosis not present

## 2023-01-13 DIAGNOSIS — K5909 Other constipation: Secondary | ICD-10-CM | POA: Diagnosis not present

## 2023-01-13 MED ORDER — LINACLOTIDE 145 MCG PO CAPS: 145.0000 ug | ORAL_CAPSULE | Freq: Every day | ORAL | 0 refills | Status: AC

## 2023-01-13 NOTE — Patient Instructions (Signed)
We have given you samples of the following medication to take: Linzess 145 mcg daily before breakfast.  Send Mychart message in 10-12 days with update.   Follow up with GYN.  _______________________________________________________  If your blood pressure at your visit was 140/90 or greater, please contact your primary care physician to follow up on this.  _______________________________________________________  If you are age 45 or older, your body mass index should be between 23-30. Your Body mass index is 36.58 kg/m. If this is out of the aforementioned range listed, please consider follow up with your Primary Care Provider.  If you are age 49 or younger, your body mass index should be between 19-25. Your Body mass index is 36.58 kg/m. If this is out of the aformentioned range listed, please consider follow up with your Primary Care Provider.   ________________________________________________________  The [facility] providers would like to encourage you to use [facility] to communicate with providers for non-urgent requests or questions.  Due to long hold times on the telephone, sending your provider a message by [facility] may be a faster and more efficient way to get a response.  Please allow 48 business hours for a response.  Please remember that this is for non-urgent requests.  _______________________________________________________

## 2023-01-13 NOTE — Progress Notes (Signed)
01/13/2023 Michaela Thomas [id number] [birth year]   HISTORY OF PRESENT ILLNESS: [name] who is a patient of Dr. Orvan Falconer.  She has been experiencing episodes of right lower quadrant abdominal pain intermittently since September.  Very severe when this occurs.  Has been in the ER 3 times since then with imaging and labs each time.  Most recently just 3 days ago.  After the acute pain resolves then she has no other symptoms in between except for her constipation.  None of her most recent CT scans and imaging has showed any significant stool burden.  Also no other findings on recent colonoscopy or multiple imaging scans, labs, etc.  She does still have her clip on her right ovary from hr tubal ligation.  GYN removed the left one last year, but they could not find the right one.  Says that in regards to her constipation she does not feel like MiraLAX, etc. helps very much even though she does take it regularly.  She does take dicyclomine for generalized abdominal discomfort and it helps with that, but does not help with the right sided abdominal pain that she gets.  CT scan of the abdomen and pelvis with contrast 01/10/2023:  IMPRESSION: 1. No evidence for acute abnormality of the abdomen or pelvis. 2. Normal appendix. 3. Enlarged uterus. Stable low-attenuation lesions in the uterus, possibly representing fibroids and/or cystic degeneration of fibroids. Recommend follow-up pelvic ultrasound to assess stability. 4. No adnexal mass.  Colonoscopy 05/2022 with Dr. Orvan Falconer had two polyps removed.  1. Surgical [P], colon nos, random sites - BENIGN COLONIC MUCOSA WITH NO SPECIFIC PATHOLOGIC CHANGES - NEGATIVE FOR INCREASED INTRAEPITHELIAL LYMPHOCYTES OR THICKENED SUBEPITHELIAL COLLAGEN TABLE - NEGATIVE FOR DYSPLASIA OR MALIGNANCY 2. Surgical [P], colon, terminal ileum - BENIGN SMALL BOWEL MUCOSA WITH NO SIGNIFICANT PATHOLOGIC CHANGES 3. Surgical [P], colon, descending, rectal, polyp  (2) - TUBULAR ADENOMA. - NO HIGH GRADE DYSPLASIA OR MALIGNANCY. - HYPERPLASTIC POLYP. - NO DYSPLASIA OR MALIGNANCY.  Past Medical History:  Diagnosis Date   Back pain    Muscle spasms   Diabetes mellitus without complication (HCC)    Hyperlipidemia    Hypertension    Past Surgical History:  Procedure Laterality Date   COLPOSCOPY  08/24/1999   ESSURE TUBAL LIGATION     2017   HYSTEROSCOPY WITH D & C Left 12/25/2021   Procedure: DILATATION AND CURETTAGE /HYSTEROSCOPY/DIAGNOSTIC HYSTEROSCOPY UNDER ULTRASOUND GUIDANCE WITH ESSURE REMOVAL;  Surgeon: Edwinna Areola, DO;  Location: [facility];  Service: Gynecology;  Laterality: Left;  removal of essure in uterus   HYSTEROSCOPY WITH NOVASURE     2018   OPERATIVE ULTRASOUND N/A 12/25/2021   Procedure: OPERATIVE ULTRASOUND;  Surgeon: Edwinna Areola, DO;  Location:  SURGERY CENTER;  Service: Gynecology;  Laterality: N/A;    reports that she has been smoking cigarettes. She has a 2.50 pack-year smoking history. She has never used smokeless tobacco. She reports current alcohol use of about 2.0 standard drinks of alcohol per week. She reports that she does not use drugs. family history includes Breast cancer in her mother; Cancer in her father; Cancer (age of onset: 48) in her mother; Diabetes in her father; Hypertension in her father. Allergies  Allergen Reactions   Codeine Nausea And Vomiting    Pt says any Oxy or codeine makes her violently sick; Not affected by Morphine   Metformin And Related Diarrhea    Severe diarrhea  Outpatient Encounter Medications as of 01/13/2023  Medication Sig   albuterol (PROVENTIL HFA;VENTOLIN HFA) 108 (90 Base) MCG/ACT inhaler Inhale 2 puffs into the lungs every 6 (six) hours as needed for wheezing or shortness of breath.   amLODipine (NORVASC) 5 MG tablet    cyclobenzaprine (FLEXERIL) 10 MG tablet Take 1 tablet (10 mg total) by mouth 2 (two) times daily as needed  for muscle spasms.   dicyclomine (BENTYL) 20 MG tablet Take 1 tablet (20 mg total) by mouth 3 (three) times daily as needed for up to 5 days for spasms.   glipiZIDE (GLUCOTROL) 5 MG tablet Take 0.5 tablets (2.5 mg total) by mouth daily before breakfast.   hydrochlorothiazide (HYDRODIURIL) 25 MG tablet TAKE 1 TABLET BY MOUTH EVERY DAY   ibuprofen (ADVIL) 600 MG tablet Take 1 tablet (600 mg total) by mouth every 6 (six) hours as needed.   ondansetron (ZOFRAN) 4 MG tablet Take 1 tablet (4 mg total) by mouth every 8 (eight) hours as needed for up to 5 days for nausea or vomiting.   OZEMPIC, 1 MG/DOSE, 4 MG/3ML SOPN Inject 1 mg into the skin once a week.   Probiotic Product (PROBIOTIC DAILY PO) Take by mouth.   rosuvastatin (CRESTOR) 5 MG tablet Take 1 tablet (5 mg total) by mouth daily.   traMADol (ULTRAM) 50 MG tablet Take 50 mg by mouth every 6 (six) hours as needed.   valACYclovir (VALTREX) 500 MG tablet Take 1 tablet (500 mg total) by mouth 2 (two) times daily as needed.   Vitamin D, Ergocalciferol, (DRISDOL) 50000 units CAPS capsule Take 1 capsule (50,000 Units total) by mouth every 7 (seven) days.   omeprazole (PRILOSEC) 20 MG capsule Take 1 capsule (20 mg total) by mouth daily.   No facility-administered encounter medications on file as of 01/13/2023.     REVIEW OF SYSTEMS  : All other systems reviewed and negative except where noted in the History of Present Illness.   PHYSICAL EXAM: BP 110/78 (BP Location: Left Arm, Patient Position: Sitting, Cuff Size: Normal)   Pulse 72   Ht 5\' 3"  (1.6 m)   Wt 206 lb 8 oz (93.7 kg)   BMI 36.58 kg/m  General: Well developed female in no acute distress Head: Normocephalic and atraumatic Eyes:  Sclerae anicteric, conjunctiva pink. Ears: Normal auditory acuity Lungs: Clear throughout to auscultation; no W/R/R. Heart: Regular rate and rhythm; no M/R/G. Abdomen: Soft, non-distended.  BS present.  Mild diffuse TTP. Musculoskeletal: Symmetrical with  no gross deformities  Skin: No lesions on visible extremities Extremities: No edema  Neurological: Alert oriented x 4, grossly non-focal Psychological:  Alert and cooperative. Normal mood and affect  ASSESSMENT AND PLAN: *Right lower quadrant abdominal pain: Has been having these episodes of right lower quadrant abdominal pain.  Very severe when this occurs.  Has been happening intermittently since September.  Has been in the ER 3 times since then.  After the acute pain resolves then she has no other symptoms in between except for her constipation.  None of her most recent CT scans and imaging has showed any significant stool burden so I do not know at this point that we can blame this on the constipation.  Also no other findings on recent colonoscopy or multiple imaging scans, labs, etc.  She does still have her clip on her right ovary from hr tubal ligation.  GYN removed the left one last year, but they could not find the right one.  I wonder  if that is intermittently causing her pain.  She is going to follow-up with her GYN.  Hopefully they can remove it and we will see if she gets resolution of her symptoms.  Otherwise she uses Bentyl for her generalized abdominal discomfort, but that does not help for the severe right-sided abdominal pain that she experiences. *Chronic constipation: MiraLAX, etc.  Will try Linzess 145 mcg daily.  Samples were given.  She will send [place] a message in approximately 10 days with an update at which time we can send a prescription for either this current dose or titrate up or down if needed.   CC:  Deeann Saint, MD

## 2023-01-14 ENCOUNTER — Encounter: Payer: Self-pay | Admitting: Gastroenterology

## 2023-01-14 DIAGNOSIS — R1031 Right lower quadrant pain: Secondary | ICD-10-CM | POA: Diagnosis not present

## 2023-01-18 NOTE — Progress Notes (Signed)
____________________________________________________________  Attending physician addendum:  Thank you for sending this case to me. I have reviewed the entire note and agree with the plan.  I agree that it severity and episodic nature with negative workup to date suggest the possibility of an abdominal and pelvic adhesive band or perhaps local inflammatory reaction to surgical clips.  Amada Jupiter, MD  ____________________________________________________________

## 2023-01-28 DIAGNOSIS — R1031 Right lower quadrant pain: Secondary | ICD-10-CM | POA: Diagnosis not present

## 2023-02-22 DIAGNOSIS — R1031 Right lower quadrant pain: Secondary | ICD-10-CM | POA: Diagnosis not present

## 2023-03-04 ENCOUNTER — Other Ambulatory Visit: Payer: Self-pay | Admitting: Gastroenterology

## 2023-03-22 DIAGNOSIS — E119 Type 2 diabetes mellitus without complications: Secondary | ICD-10-CM | POA: Diagnosis not present

## 2023-03-22 DIAGNOSIS — R1031 Right lower quadrant pain: Secondary | ICD-10-CM | POA: Diagnosis not present

## 2023-03-22 DIAGNOSIS — R768 Other specified abnormal immunological findings in serum: Secondary | ICD-10-CM | POA: Diagnosis not present

## 2023-03-22 DIAGNOSIS — I152 Hypertension secondary to endocrine disorders: Secondary | ICD-10-CM | POA: Diagnosis not present

## 2023-03-22 DIAGNOSIS — E1159 Type 2 diabetes mellitus with other circulatory complications: Secondary | ICD-10-CM | POA: Diagnosis not present

## 2023-04-20 ENCOUNTER — Encounter (HOSPITAL_BASED_OUTPATIENT_CLINIC_OR_DEPARTMENT_OTHER): Payer: Self-pay | Admitting: Obstetrics and Gynecology

## 2023-04-20 ENCOUNTER — Other Ambulatory Visit: Payer: Self-pay

## 2023-04-20 DIAGNOSIS — Z01818 Encounter for other preprocedural examination: Secondary | ICD-10-CM | POA: Diagnosis not present

## 2023-04-20 DIAGNOSIS — R1031 Right lower quadrant pain: Secondary | ICD-10-CM | POA: Diagnosis not present

## 2023-04-20 DIAGNOSIS — Z01812 Encounter for preprocedural laboratory examination: Secondary | ICD-10-CM | POA: Diagnosis not present

## 2023-04-20 NOTE — Progress Notes (Signed)
Your procedure is scheduled on Thursday, 05/05/2023.  Report to [facility] AT  5:30 AM.  Call this number if you have problems the morning of surgery  :[phone].   OUR ADDRESS IS [address].  [facility] ARE LOCATED IN [facility].  PLEASE BRING YOUR INSURANCE CARD AND PHOTO ID DAY OF SURGERY.  ONLY 2 PEOPLE ARE ALLOWED IN  WAITING  ROOM                                      REMEMBER:  DO NOT EAT FOOD, CANDY GUM OR MINTS  AFTER MIDNIGHT THE NIGHT BEFORE YOUR SURGERY . YOU MAY HAVE CLEAR LIQUIDS FROM MIDNIGHT THE NIGHT BEFORE YOUR SURGERY UNTIL  4:30 AM. NO CLEAR LIQUIDS AFTER   4:30 AM DAY OF SURGERY.  YOU MAY  BRUSH YOUR TEETH MORNING OF SURGERY AND RINSE YOUR MOUTH OUT, NO CHEWING GUM CANDY OR MINTS.     CLEAR LIQUID DIET    Allowed      Water                                                                   Coffee and tea, regular and decaf  (NO cream or milk products of any type, may sweeten)                         Carbonated beverages, regular and diet                                    Sports drinks like Gatorade _____________________________________________________________________     TAKE ONLY THESE MEDICATIONS MORNING OF SURGERY:  Albuterol inhaler, Amlodipine, Flexeril if needed, Prilosec if needed, Crestor, Tramadol if needed, Valtrex if needed Do NOT take Glucotrol (glipizide) on the morning of surgery.                                        DO NOT WEAR JEWERLY/  METAL/  PIERCINGS (INCLUDING NO PLASTIC PIERCINGS) DO NOT WEAR LOTIONS, POWDERS, PERFUMES OR NAIL POLISH ON YOUR FINGERNAILS. TOENAIL POLISH IS OK TO WEAR. DO NOT SHAVE FOR 48 HOURS PRIOR TO DAY OF SURGERY.  CONTACTS, GLASSES, OR DENTURES MAY NOT BE WORN TO SURGERY.  REMEMBER: NO SMOKING, VAPING ,  DRUGS OR ALCOHOL FOR 24 HOURS BEFORE YOUR SURGERY.                                    [facility] IS NOT RESPONSIBLE  FOR ANY BELONGINGS.                                                                     [facility]  [facility] - Preparing for Surgery Before surgery, you can play an important role.  Because skin is not sterile, your skin needs to be as free of germs as possible.  You can reduce the number of germs on your skin by washing with CHG (chlorahexidine gluconate) soap before surgery.  CHG is an antiseptic cleaner which kills germs and bonds with the skin to continue killing germs even after washing. Please DO NOT use if you have an allergy to CHG or antibacterial soaps.  If your skin becomes reddened/irritated stop using the CHG and inform your nurse when you arrive at Short Stay. Do not shave (including legs and underarms) for at least 48 hours prior to the first CHG shower.  You may shave your face/neck. Please follow these instructions carefully:  1.  Shower with CHG Soap the night before surgery and the  morning of Surgery.  2.  If you choose to wash your hair, wash your hair first as usual with your  normal  shampoo.  3.  After you shampoo, rinse your hair and body thoroughly to remove the  shampoo.                                        4.  Use CHG as you would any other liquid soap.  You can apply chg directly  to the skin and wash , chg soap provided, night before and morning of your surgery.  5.  Apply the CHG Soap to your body ONLY FROM THE NECK DOWN.   Do not use on face/ open                           Wound or open sores. Avoid contact with eyes, ears mouth and genitals (private parts).                       Wash face,  Genitals (private parts) with your normal soap.             6.  Wash thoroughly, paying special attention to the area where your surgery  will be performed.  7.  Thoroughly rinse your body with warm water from the neck down.  8.  DO NOT shower/wash with your normal soap after using and rinsing off  the CHG Soap.             9.  Pat yourself dry with a clean towel.            10.  Wear clean pajamas.            11.   Place clean sheets on your bed the night of your first shower and do not  sleep with pets. Day of Surgery : Do not apply any lotions/ powders the morning of surgery.  Please wear clean clothes to the hospital/surgery center.  IF YOU HAVE ANY SKIN IRRITATION OR PROBLEMS WITH THE SURGICAL SOAP, PLEASE GET A BAR OF GOLD DIAL SOAP AND SHOWER THE NIGHT BEFORE YOUR SURGERY AND THE MORNING OF YOUR SURGERY. PLEASE LET THE NURSE KNOW MORNING OF YOUR SURGERY IF YOU HAD ANY PROBLEMS WITH THE SURGICAL SOAP.   YOUR SURGEON MAY HAVE REQUESTED EXTENDED RECOVERY TIME AFTER YOUR SURGERY. IT COULD BE A  JUST A FEW HOURS  UP TO AN OVERNIGHT STAY.  YOUR SURGEON SHOULD HAVE DISCUSSED  THIS WITH YOU PRIOR TO YOUR SURGERY. IN THE EVENT YOU NEED TO STAY OVERNIGHT PLEASE REFER TO THE FOLLOWING GUIDELINES. YOU MAY HAVE UP TO 4 VISITORS  MAY VISIT IN THE EXTENDED RECOVERY ROOM UNTIL 800 PM ONLY.  ONE  VISITOR AGE 31 AND OVER MAY SPEND THE NIGHT AND MUST BE IN EXTENDED RECOVERY ROOM NO LATER THAN 800 PM . YOUR DISCHARGE TIME AFTER YOU SPEND THE NIGHT IS 900 AM THE MORNING AFTER YOUR SURGERY. YOU MAY PACK A SMALL OVERNIGHT BAG WITH TOILETRIES FOR YOUR OVERNIGHT STAY IF YOU WISH.  REGARDLESS OF IF YOU STAY OVER NIGHT OR ARE DISCHARGED THE SAME DAY YOU WILL BE REQUIRED TO HAVE A RESPONSIBLE ADULT (18 YRS OLD OR OLDER) STAY WITH YOU FOR AT LEAST THE FIRST 24 HOURS  YOUR PRESCRIPTION MEDICATIONS WILL BE PROVIDED DURING YOUR HOSPITAL STAY.  ________________________________________________________________________                                                        QUESTIONS Mechele Claude PRE OP NURSE PHONE [phone].

## 2023-04-20 NOTE — Progress Notes (Addendum)
Spoke w/ via phone for pre-op interview---[facility] Lab needs dos---- urine pregnancy per anesthesia, surgeon orders pending              Lab results------05/03/23 lab appt for cbc, type & screen, bmp, 01/10/23 EKG in chart & Epic COVID test -----patient states asymptomatic no test needed Arrive at -------0530 on Thursday, 05/05/2023 NPO after MN NO Solid Food.  Clear liquids from MN until---0430 Med rec completed Medications to take morning of surgery -----Albuterol inhaler, Amlodipine, Flexeril prn, Prilosec prn, Crestor, Tramadol prn, Valtrex prn Diabetic medication ----Patient is no longer taking Ozempic. Hold Glucotrol on the morning of surgery. Patient instructed no nail polish to be worn day of surgery Patient instructed to bring photo id and insurance card day of surgery Patient aware to have Driver (ride ) / caregiver    for 24 hours after surgery- daughter, Nilda Riggs Patient Special Instructions -----Bring albuterol inhaler on day of surgery. Do your best not to smoke for 24 hours before surgery. Extended / overnight stay instructions given. Pre-Op special Instructions -----Requested orders from Dr. Mindi Slicker via Epic IB on 04/20/23. Patient verbalized understanding of instructions that were given at this phone interview. Patient denies shortness of breath, chest pain, fever, cough at this phone interview.

## 2023-04-24 NOTE — H&P (Signed)
Michaela Thomas is an 47 y.o. G77P2002 female here for scheduled surgery ( total laparoscopic hysterectomy with bilateral salpingectomy and cystoscopy) due to chronic abdominal pain. Pain is "debilitating" when occurs and worsening. She was in ER recently. CT and US showed enlarged uterus likely due to fibroids, US showed myometrial masses .  Pt saw GI - no cause noted.  Colonoscopy - 04/2022 precancerous polyps removed ( [place])  She is sexually active. She has had a BTL.  S/P ablation - had irregular cycles afterwards for a while but stopped > 1 year ago.   She has a chronic history of DMT2, HTN, hypercholesterolemia and chronic back pain. All stable on medication now on. On ozempic  BMI 35  Pertinent Gynecological History: Menses: flow is light Bleeding: rare Contraception: tubal ligation DES exposure: denies Blood transfusions: none Sexually transmitted diseases: no past history Previous GYN Procedures:  ablation    Last mammogram: normal Date:  09/2021 Last pap: normal Date: 12/2020 OB History: G2, P2002   Menstrual History: Menarche age: 20 Patient's last menstrual period was 08/23/2021 (exact date).    Past Medical History:  Diagnosis Date   Anxiety 2013   resolved per pt   Asthma    Follows w/ Theora Gianotti, PA.   Back pain    Muscle spasms   Chronic constipation    Follows w/ GI.   Constipation    w/ right lower quadrant pain   DM (diabetes mellitus), type 2 (HCC)    Follows w/ PCP.   Hyperlipidemia    Follows w/ PCP, Theora Gianotti, PA.   Hypertension    Right lower quadrant pain 2024   severe, per pt, unsure if GI related   Wears contact lenses    Wears glasses     Past Surgical History:  Procedure Laterality Date   COLONOSCOPY  06/18/2022   two polyps   COLPOSCOPY  08/24/1999   ESSURE TUBAL LIGATION  2017   HYSTEROSCOPY WITH D & C Left 12/25/2021   Procedure: DILATATION AND CURETTAGE /HYSTEROSCOPY/DIAGNOSTIC HYSTEROSCOPY UNDER ULTRASOUND GUIDANCE WITH  ESSURE REMOVAL;  Surgeon: Edwinna Areola, DO;  Location: [facility];  Service: Gynecology;  Laterality: Left;  removal of essure in uterus   HYSTEROSCOPY WITH NOVASURE  2018   OPERATIVE ULTRASOUND N/A 12/25/2021   Procedure: OPERATIVE ULTRASOUND;  Surgeon: Edwinna Areola, DO;  Location: [facility];  Service: Gynecology;  Laterality: N/A;    Family History  Problem Relation Age of Onset   Cancer Mother 68       breast   Breast cancer Mother    Cancer Father        prostate   Hypertension Father    Diabetes Father    Colon cancer Neg Hx    Esophageal cancer Neg Hx    Stomach cancer Neg Hx    Rectal cancer Neg Hx     Social History:  reports that she has been smoking cigarettes. She has a 2.5 pack-year smoking history. She has never used smokeless tobacco. She reports current alcohol use of about 1.0 standard drink of alcohol per week. She reports that she does not use drugs.  Allergies:  Allergies  Allergen Reactions   Codeine Nausea And Vomiting    Pt says any Oxy or codeine makes her violently sick; Not affected by Morphine   Metformin And Related Diarrhea    Severe diarrhea    No medications prior to admission.    Review of Systems  Constitutional:  Positive for activity change and fatigue.  Eyes:  Negative for photophobia and visual disturbance.  Respiratory:  Negative for chest tightness and shortness of breath.   Cardiovascular:  Negative for chest pain, palpitations and leg swelling.  Gastrointestinal:  Positive for abdominal pain.  Genitourinary:  Positive for dyspareunia and pelvic pain. Negative for menstrual problem and vaginal pain.  Musculoskeletal:  Negative for back pain.  Neurological:  Negative for light-headedness and headaches.  Psychiatric/Behavioral:  The patient is not nervous/anxious.     Height 5\' 4"  (1.626 m), weight 98.9 kg, last menstrual period 08/23/2021. Physical Exam Vitals and nursing note  reviewed. Exam conducted with a chaperone present.  Constitutional:      Appearance: Normal appearance. She is obese.  Cardiovascular:     Pulses: Normal pulses.  Pulmonary:     Effort: Pulmonary effort is normal.  Genitourinary:    General: Normal vulva.  Musculoskeletal:        General: Normal range of motion.     Cervical back: Normal range of motion.  Skin:    General: Skin is warm and dry.     Capillary Refill: Capillary refill takes 2 to 3 seconds.  Neurological:     General: No focal deficit present.     Mental Status: She is alert and oriented to person, place, and time. Mental status is at baseline.     No results found for this or any previous visit (from the past 24 hour(s)).  No results found.  Assessment/Plan: 47yo G2P2002 female with chronic pelvic pain and fibroids here for scheduled total laparoscopic hysterectomy with bilateral salpingectomy and cystoscopy, possible open  - Admit  - ERAS protocol - Confirm consent  - To OR when ready  [name] 04/24/2023, 5:36 AM

## 2023-04-29 DIAGNOSIS — Z01812 Encounter for preprocedural laboratory examination: Secondary | ICD-10-CM | POA: Diagnosis not present

## 2023-05-03 ENCOUNTER — Encounter (HOSPITAL_COMMUNITY)
Admission: RE | Admit: 2023-05-03 | Discharge: 2023-05-03 | Disposition: A | Discharge status: Home / Self Care | Payer: Federal, State, Local not specified - PPO | Source: Ambulatory Visit | Attending: Obstetrics and Gynecology

## 2023-05-03 DIAGNOSIS — Z01812 Encounter for preprocedural laboratory examination: Secondary | ICD-10-CM | POA: Diagnosis not present

## 2023-05-03 DIAGNOSIS — Z01818 Encounter for other preprocedural examination: Secondary | ICD-10-CM | POA: Diagnosis not present

## 2023-05-03 DIAGNOSIS — R1031 Right lower quadrant pain: Secondary | ICD-10-CM | POA: Diagnosis not present

## 2023-05-03 LAB — COMPREHENSIVE METABOLIC PANEL
ALT: 27 U/L (ref 0–44)
AST: 17 U/L (ref 15–41)
Albumin: 4 g/dL (ref 3.5–5.0)
Alkaline Phosphatase: 54 U/L (ref 38–126)
Anion gap: 9 (ref 5–15)
BUN: 14 mg/dL (ref 6–20)
CO2: 25 mmol/L (ref 22–32)
Calcium: 9.7 mg/dL (ref 8.9–10.3)
Chloride: 102 mmol/L (ref 98–111)
Creatinine, Ser: 0.65 mg/dL (ref 0.44–1.00)
GFR, Estimated: >60 mL/min (ref >60)
Glucose, Bld: 144 mg/dL — ABNORMAL HIGH (ref 70–99)
Potassium: 3.6 mmol/L (ref 3.5–5.1)
Sodium: 136 mmol/L (ref 135–145)
Total Bilirubin: 0.6 mg/dL (ref 0.3–1.2)
Total Protein: 7.5 g/dL (ref 6.5–8.1)

## 2023-05-03 LAB — CBC
HCT: 36 % (ref 36.0–46.0)
Hemoglobin: 11.8 g/dL — ABNORMAL LOW (ref 12.0–15.0)
MCH: 27.4 pg (ref 26.0–34.0)
MCHC: 32.8 g/dL (ref 30.0–36.0)
MCV: 83.7 fL (ref 80.0–100.0)
Platelets: 415 10*3/uL — ABNORMAL HIGH (ref 150–400)
RBC: 4.3 MIL/uL (ref 3.87–5.11)
RDW: 14.4 % (ref 11.5–15.5)
WBC: 7.1 10*3/uL (ref 4.0–10.5)
nRBC: 0 % (ref 0.0–0.2)

## 2023-05-03 LAB — HIV ANTIBODY (ROUTINE TESTING W REFLEX): HIV Screen 4th Generation wRfx: NONREACTIVE

## 2023-05-04 NOTE — Anesthesia Preprocedure Evaluation (Signed)
Anesthesia Evaluation  Patient identified by MRN, date of birth, ID band Patient awake    Reviewed: Allergy & Precautions, NPO status , Patient's Chart, lab work & pertinent test results  History of Anesthesia Complications Negative for: history of anesthetic complications  Airway Mallampati: I  TM Distance: >3 FB Neck ROM: Full    Dental  (+) Chipped, Dental Advisory Given   Pulmonary asthma , COPD,  COPD inhaler, Current Smoker and Patient abstained from smoking.   breath sounds clear to auscultation       Cardiovascular hypertension, Pt. on medications (-) angina  Rhythm:Regular Rate:Normal     Neuro/Psych negative neurological ROS     GI/Hepatic negative GI ROS, Neg liver ROS,,,  Endo/Other  diabetes (glu 136), Oral Hypoglycemic Agents  BMI 37 Ozempic: has not taken for last 6 months  Renal/GU negative Renal ROS     Musculoskeletal   Abdominal   Peds  Hematology negative hematology ROS (+)   Anesthesia Other Findings   Reproductive/Obstetrics                             Anesthesia Physical Anesthesia Plan  ASA: 3  Anesthesia Plan: General   Post-op Pain Management: Tylenol PO (pre-op)*   Induction: Intravenous  PONV Risk Score and Plan: 2 and Ondansetron and Dexamethasone  Airway Management Planned: Oral ETT  Additional Equipment: None  Intra-op Plan:   Post-operative Plan: Extubation in OR  Informed Consent: I have reviewed the patients History and Physical, chart, labs and discussed the procedure including the risks, benefits and alternatives for the proposed anesthesia with the patient or authorized representative who has indicated his/her understanding and acceptance.     Dental advisory given  Plan Discussed with: CRNA and Surgeon  Anesthesia Plan Comments:         Anesthesia Quick Evaluation

## 2023-05-05 ENCOUNTER — Other Ambulatory Visit: Payer: Self-pay

## 2023-05-05 ENCOUNTER — Encounter (HOSPITAL_BASED_OUTPATIENT_CLINIC_OR_DEPARTMENT_OTHER): Payer: Self-pay | Admitting: Obstetrics and Gynecology

## 2023-05-05 ENCOUNTER — Encounter (HOSPITAL_BASED_OUTPATIENT_CLINIC_OR_DEPARTMENT_OTHER)
Admission: RE | Disposition: A | Discharge status: Home / Self Care | Payer: Self-pay | Source: Home / Self Care | Attending: Obstetrics and Gynecology

## 2023-05-05 ENCOUNTER — Ambulatory Visit (HOSPITAL_BASED_OUTPATIENT_CLINIC_OR_DEPARTMENT_OTHER): Payer: Federal, State, Local not specified - PPO | Admitting: Anesthesiology

## 2023-05-05 ENCOUNTER — Ambulatory Visit (HOSPITAL_BASED_OUTPATIENT_CLINIC_OR_DEPARTMENT_OTHER)
Admission: RE | Admit: 2023-05-05 | Discharge: 2023-05-06 | Disposition: A | Discharge status: Home / Self Care | Payer: Federal, State, Local not specified - PPO | Attending: Obstetrics and Gynecology | Admitting: Obstetrics and Gynecology

## 2023-05-05 DIAGNOSIS — Z833 Family history of diabetes mellitus: Secondary | ICD-10-CM | POA: Diagnosis not present

## 2023-05-05 DIAGNOSIS — J4489 Other specified chronic obstructive pulmonary disease: Secondary | ICD-10-CM | POA: Insufficient documentation

## 2023-05-05 DIAGNOSIS — E119 Type 2 diabetes mellitus without complications: Secondary | ICD-10-CM | POA: Insufficient documentation

## 2023-05-05 DIAGNOSIS — Z01818 Encounter for other preprocedural examination: Secondary | ICD-10-CM

## 2023-05-05 DIAGNOSIS — I10 Essential (primary) hypertension: Secondary | ICD-10-CM | POA: Diagnosis not present

## 2023-05-05 DIAGNOSIS — D259 Leiomyoma of uterus, unspecified: Secondary | ICD-10-CM | POA: Diagnosis not present

## 2023-05-05 DIAGNOSIS — Z8249 Family history of ischemic heart disease and other diseases of the circulatory system: Secondary | ICD-10-CM | POA: Diagnosis not present

## 2023-05-05 DIAGNOSIS — G8929 Other chronic pain: Secondary | ICD-10-CM | POA: Diagnosis not present

## 2023-05-05 DIAGNOSIS — N939 Abnormal uterine and vaginal bleeding, unspecified: Secondary | ICD-10-CM | POA: Diagnosis not present

## 2023-05-05 DIAGNOSIS — R102 Pelvic and perineal pain unspecified side: Secondary | ICD-10-CM | POA: Diagnosis present

## 2023-05-05 DIAGNOSIS — N946 Dysmenorrhea, unspecified: Secondary | ICD-10-CM | POA: Diagnosis not present

## 2023-05-05 DIAGNOSIS — N736 Female pelvic peritoneal adhesions (postinfective): Secondary | ICD-10-CM | POA: Diagnosis not present

## 2023-05-05 DIAGNOSIS — R1031 Right lower quadrant pain: Secondary | ICD-10-CM

## 2023-05-05 DIAGNOSIS — K660 Peritoneal adhesions (postprocedural) (postinfection): Secondary | ICD-10-CM | POA: Diagnosis not present

## 2023-05-05 DIAGNOSIS — Z9889 Other specified postprocedural states: Secondary | ICD-10-CM

## 2023-05-05 DIAGNOSIS — F17210 Nicotine dependence, cigarettes, uncomplicated: Secondary | ICD-10-CM | POA: Diagnosis not present

## 2023-05-05 HISTORY — PX: CYSTOSCOPY: SHX5120

## 2023-05-05 HISTORY — DX: Type 2 diabetes mellitus without complications: E11.9

## 2023-05-05 HISTORY — DX: Other constipation: K59.09

## 2023-05-05 HISTORY — DX: Presence of spectacles and contact lenses: Z97.3

## 2023-05-05 HISTORY — PX: TOTAL LAPAROSCOPIC HYSTERECTOMY WITH SALPINGECTOMY: SHX6742

## 2023-05-05 HISTORY — DX: Unspecified asthma, uncomplicated: J45.909

## 2023-05-05 HISTORY — DX: Constipation, unspecified: K59.00

## 2023-05-05 HISTORY — PX: LAPAROSCOPIC LYSIS OF ADHESIONS: SHX5905

## 2023-05-05 LAB — GLUCOSE, CAPILLARY
Glucose-Capillary: 136 mg/dL — ABNORMAL HIGH (ref 70–99)
Glucose-Capillary: 203 mg/dL — ABNORMAL HIGH (ref 70–99)
Glucose-Capillary: 330 mg/dL — ABNORMAL HIGH (ref 70–99)
Glucose-Capillary: 220 mg/dL — ABNORMAL HIGH (ref 70–99)

## 2023-05-05 LAB — TYPE AND SCREEN
ABO/RH(D): B POS
Antibody Screen: NEGATIVE

## 2023-05-05 LAB — POCT PREGNANCY, URINE: Preg Test, Ur: NEGATIVE

## 2023-05-05 SURGERY — HYSTERECTOMY, TOTAL, LAPAROSCOPIC, WITH SALPINGECTOMY
Anesthesia: General | Site: Pelvis

## 2023-05-05 MED ORDER — ACETAMINOPHEN 500 MG PO TABS
ORAL_TABLET | ORAL | Status: AC
  Filled 2023-05-05: qty 2

## 2023-05-05 MED ORDER — MENTHOL 3 MG MT LOZG: 1.0000 | LOZENGE | OROMUCOSAL | Status: DC | PRN

## 2023-05-05 MED ORDER — OXYCODONE HCL 5 MG PO TABS
ORAL_TABLET | ORAL | Status: AC
  Filled 2023-05-05: qty 2

## 2023-05-05 MED ORDER — SIMETHICONE 80 MG PO CHEW
80.0000 mg | CHEWABLE_TABLET | Freq: Four times a day (QID) | ORAL | Status: DC | PRN
  Administered 2023-05-05 (×2): 80 mg via ORAL

## 2023-05-05 MED ORDER — KETOROLAC TROMETHAMINE 30 MG/ML IJ SOLN
INTRAMUSCULAR | Status: DC | PRN
Start: 2023-05-05 — End: 2023-05-05
  Administered 2023-05-05: 30 mg via INTRAVENOUS

## 2023-05-05 MED ORDER — IBUPROFEN 200 MG PO TABS
ORAL_TABLET | ORAL | Status: AC
  Filled 2023-05-05: qty 3

## 2023-05-05 MED ORDER — MENTHOL 3 MG MT LOZG
LOZENGE | OROMUCOSAL | Status: AC
  Filled 2023-05-05: qty 9

## 2023-05-05 MED ORDER — OXYCODONE HCL 5 MG PO TABS: 5.0000 mg | ORAL_TABLET | Freq: Once | ORAL | Status: DC | PRN

## 2023-05-05 MED ORDER — SODIUM CHLORIDE 0.9 % IR SOLN
Status: DC | PRN
  Administered 2023-05-05: 1000 mL

## 2023-05-05 MED ORDER — LIDOCAINE 2% (20 MG/ML) 5 ML SYRINGE
INTRAMUSCULAR | Status: DC | PRN
  Administered 2023-05-05: 40 mg via INTRAVENOUS

## 2023-05-05 MED ORDER — FENTANYL CITRATE (PF) 250 MCG/5ML IJ SOLN
INTRAMUSCULAR | Status: DC | PRN
  Administered 2023-05-05 (×3): 25 ug via INTRAVENOUS
  Administered 2023-05-05: 100 ug via INTRAVENOUS
  Administered 2023-05-05: 25 ug via INTRAVENOUS
  Administered 2023-05-05: 50 ug via INTRAVENOUS

## 2023-05-05 MED ORDER — IBUPROFEN 200 MG PO TABS
600.0000 mg | ORAL_TABLET | Freq: Four times a day (QID) | ORAL | Status: DC
  Administered 2023-05-05 – 2023-05-06 (×3): 600 mg via ORAL

## 2023-05-05 MED ORDER — TRANEXAMIC ACID-NACL 1000-0.7 MG/100ML-% IV SOLN
1000.0000 mg | Freq: Once | INTRAVENOUS | Status: AC
  Administered 2023-05-05: 1000 mg via INTRAVENOUS

## 2023-05-05 MED ORDER — ROSUVASTATIN CALCIUM 10 MG PO TABS
10.0000 mg | ORAL_TABLET | Freq: Every day | ORAL | Status: DC
  Administered 2023-05-05: 10 mg via ORAL
  Filled 2023-05-05: qty 1

## 2023-05-05 MED ORDER — PROPOFOL 10 MG/ML IV BOLUS
INTRAVENOUS | Status: AC
  Filled 2023-05-05: qty 20

## 2023-05-05 MED ORDER — SENNA 8.6 MG PO TABS
ORAL_TABLET | ORAL | Status: AC
  Filled 2023-05-05: qty 1

## 2023-05-05 MED ORDER — HYDROMORPHONE HCL 1 MG/ML IJ SOLN
INTRAMUSCULAR | Status: AC
  Filled 2023-05-05: qty 1

## 2023-05-05 MED ORDER — SENNA 8.6 MG PO TABS
1.0000 | ORAL_TABLET | Freq: Two times a day (BID) | ORAL | Status: DC
  Administered 2023-05-05: 8.6 mg via ORAL

## 2023-05-05 MED ORDER — OXYCODONE-ACETAMINOPHEN 5-325 MG PO TABS: 1.0000 | ORAL_TABLET | ORAL | 0 refills | Status: AC | PRN

## 2023-05-05 MED ORDER — DEXAMETHASONE SODIUM PHOSPHATE 10 MG/ML IJ SOLN
INTRAMUSCULAR | Status: DC | PRN
  Administered 2023-05-05: 10 mg via INTRAVENOUS

## 2023-05-05 MED ORDER — ONDANSETRON HCL 4 MG PO TABS: 4.0000 mg | ORAL_TABLET | Freq: Three times a day (TID) | ORAL | 0 refills | Status: AC | PRN

## 2023-05-05 MED ORDER — SCOPOLAMINE 1 MG/3DAYS TD PT72
1.0000 | MEDICATED_PATCH | TRANSDERMAL | Status: DC
  Administered 2023-05-05: 1.5 mg via TRANSDERMAL

## 2023-05-05 MED ORDER — OXYCODONE HCL 5 MG PO TABS
5.0000 mg | ORAL_TABLET | ORAL | Status: DC | PRN
  Administered 2023-05-05 – 2023-05-06 (×6): 10 mg via ORAL

## 2023-05-05 MED ORDER — ONDANSETRON HCL 4 MG/2ML IJ SOLN: 4.0000 mg | Freq: Four times a day (QID) | INTRAMUSCULAR | Status: DC | PRN

## 2023-05-05 MED ORDER — CEFAZOLIN SODIUM-DEXTROSE 2-4 GM/100ML-% IV SOLN
INTRAVENOUS | Status: AC
  Filled 2023-05-05: qty 100

## 2023-05-05 MED ORDER — SIMETHICONE 80 MG PO CHEW
CHEWABLE_TABLET | ORAL | Status: AC
  Filled 2023-05-05: qty 1

## 2023-05-05 MED ORDER — ACETAMINOPHEN 500 MG PO TABS
1000.0000 mg | ORAL_TABLET | Freq: Once | ORAL | Status: AC
  Administered 2023-05-05: 1000 mg via ORAL

## 2023-05-05 MED ORDER — GLIPIZIDE 5 MG PO TABS
2.5000 mg | ORAL_TABLET | Freq: Every day | ORAL | Status: DC
  Administered 2023-05-05 – 2023-05-06 (×2): 2.5 mg via ORAL
  Filled 2023-05-05 (×2): qty 0.5
  Filled 2023-05-05: qty 1

## 2023-05-05 MED ORDER — IBUPROFEN 600 MG PO TABS: 600.0000 mg | ORAL_TABLET | Freq: Four times a day (QID) | ORAL | 1 refills | Status: AC | PRN

## 2023-05-05 MED ORDER — PANTOPRAZOLE SODIUM 40 MG PO TBEC
DELAYED_RELEASE_TABLET | ORAL | Status: AC
  Filled 2023-05-05: qty 1

## 2023-05-05 MED ORDER — KETOROLAC TROMETHAMINE 30 MG/ML IJ SOLN: 30.0000 mg | Freq: Once | INTRAMUSCULAR | Status: DC

## 2023-05-05 MED ORDER — HYDROCHLOROTHIAZIDE 25 MG PO TABS
25.0000 mg | ORAL_TABLET | Freq: Every day | ORAL | Status: DC
  Administered 2023-05-05: 25 mg via ORAL
  Filled 2023-05-05: qty 1

## 2023-05-05 MED ORDER — GLIPIZIDE 2.5 MG HALF TABLET: 2.5000 mg | ORAL_TABLET | Freq: Every day | ORAL | Status: DC

## 2023-05-05 MED ORDER — TRANEXAMIC ACID-NACL 1000-0.7 MG/100ML-% IV SOLN
INTRAVENOUS | Status: AC
  Filled 2023-05-05: qty 100

## 2023-05-05 MED ORDER — MEPERIDINE HCL 25 MG/ML IJ SOLN: 6.2500 mg | INTRAMUSCULAR | Status: DC | PRN

## 2023-05-05 MED ORDER — LACTATED RINGERS IV SOLN
INTRAVENOUS | Status: DC
  Administered 2023-05-05: 1000 mL via INTRAVENOUS

## 2023-05-05 MED ORDER — SCOPOLAMINE 1 MG/3DAYS TD PT72
MEDICATED_PATCH | TRANSDERMAL | Status: AC
  Filled 2023-05-05: qty 1

## 2023-05-05 MED ORDER — PROPOFOL 10 MG/ML IV BOLUS
INTRAVENOUS | Status: DC | PRN
  Administered 2023-05-05: 120 mg via INTRAVENOUS

## 2023-05-05 MED ORDER — POVIDONE-IODINE 10 % EX SWAB
2.0000 | Freq: Once | CUTANEOUS | Status: AC
  Administered 2023-05-05: 2 via TOPICAL

## 2023-05-05 MED ORDER — MIDAZOLAM HCL 2 MG/2ML IJ SOLN
INTRAMUSCULAR | Status: AC
  Filled 2023-05-05: qty 2

## 2023-05-05 MED ORDER — PROMETHAZINE HCL 25 MG/ML IJ SOLN: 6.2500 mg | INTRAMUSCULAR | Status: DC | PRN

## 2023-05-05 MED ORDER — ONDANSETRON HCL 4 MG/2ML IJ SOLN
INTRAMUSCULAR | Status: DC | PRN
  Administered 2023-05-05: 4 mg via INTRAVENOUS

## 2023-05-05 MED ORDER — ROCURONIUM BROMIDE 10 MG/ML (PF) SYRINGE
PREFILLED_SYRINGE | INTRAVENOUS | Status: DC | PRN
  Administered 2023-05-05: 20 mg via INTRAVENOUS
  Administered 2023-05-05: 60 mg via INTRAVENOUS

## 2023-05-05 MED ORDER — SENNOSIDES-DOCUSATE SODIUM 8.6-50 MG PO TABS: 1.0000 | ORAL_TABLET | Freq: Every evening | ORAL | Status: DC | PRN

## 2023-05-05 MED ORDER — FENTANYL CITRATE (PF) 250 MCG/5ML IJ SOLN
INTRAMUSCULAR | Status: AC
  Filled 2023-05-05: qty 5

## 2023-05-05 MED ORDER — BUPIVACAINE HCL (PF) 0.25 % IJ SOLN
INTRAMUSCULAR | Status: DC | PRN
  Administered 2023-05-05: 9 mL

## 2023-05-05 MED ORDER — PANTOPRAZOLE SODIUM 40 MG PO TBEC
40.0000 mg | DELAYED_RELEASE_TABLET | Freq: Every day | ORAL | Status: DC
  Administered 2023-05-05: 40 mg via ORAL

## 2023-05-05 MED ORDER — LACTATED RINGERS IV SOLN: INTRAVENOUS | Status: DC

## 2023-05-05 MED ORDER — ONDANSETRON HCL 4 MG PO TABS: 4.0000 mg | ORAL_TABLET | Freq: Four times a day (QID) | ORAL | Status: DC | PRN

## 2023-05-05 MED ORDER — SUGAMMADEX SODIUM 200 MG/2ML IV SOLN
INTRAVENOUS | Status: DC | PRN
  Administered 2023-05-05: 200 mg via INTRAVENOUS

## 2023-05-05 MED ORDER — HYDROMORPHONE HCL 1 MG/ML IJ SOLN
0.2500 mg | INTRAMUSCULAR | Status: DC | PRN
  Administered 2023-05-05 (×6): 0.25 mg via INTRAVENOUS

## 2023-05-05 MED ORDER — CEFAZOLIN SODIUM-DEXTROSE 2-4 GM/100ML-% IV SOLN
2.0000 g | INTRAVENOUS | Status: AC
  Administered 2023-05-05: 2 g via INTRAVENOUS

## 2023-05-05 MED ORDER — MIDAZOLAM HCL 2 MG/2ML IJ SOLN: 0.5000 mg | Freq: Once | INTRAMUSCULAR | Status: DC | PRN

## 2023-05-05 MED ORDER — 0.9 % SODIUM CHLORIDE (POUR BTL) OPTIME
TOPICAL | Status: DC | PRN
  Administered 2023-05-05: 500 mL

## 2023-05-05 MED ORDER — AMLODIPINE BESYLATE 5 MG PO TABS
5.0000 mg | ORAL_TABLET | Freq: Every day | ORAL | Status: DC
  Administered 2023-05-05: 5 mg via ORAL
  Filled 2023-05-05: qty 1

## 2023-05-05 MED ORDER — NICOTINE 14 MG/24HR TD PT24
14.0000 mg | MEDICATED_PATCH | Freq: Every day | TRANSDERMAL | Status: DC
  Administered 2023-05-05: 14 mg via TRANSDERMAL
  Filled 2023-05-05: qty 1

## 2023-05-05 MED ORDER — OXYCODONE HCL 5 MG/5ML PO SOLN: 5.0000 mg | Freq: Once | ORAL | Status: DC | PRN

## 2023-05-05 MED ORDER — MIDAZOLAM HCL 2 MG/2ML IJ SOLN
INTRAMUSCULAR | Status: DC | PRN
  Administered 2023-05-05: 2 mg via INTRAVENOUS

## 2023-05-05 MED ORDER — ACETAMINOPHEN 500 MG PO TABS
1000.0000 mg | ORAL_TABLET | Freq: Four times a day (QID) | ORAL | Status: DC
  Administered 2023-05-05 – 2023-05-06 (×4): 1000 mg via ORAL

## 2023-05-05 SURGICAL SUPPLY — 56 items
ADH SKN CLS APL DERMABOND .7 (GAUZE/BANDAGES/DRESSINGS) ×3
BARRIER ADHS 3X4 INTERCEED (GAUZE/BANDAGES/DRESSINGS) IMPLANT
BRR ADH 4X3 ABS CNTRL BYND (GAUZE/BANDAGES/DRESSINGS)
CABLE HIGH FREQUENCY MONO STRZ (ELECTRODE) IMPLANT
CELL SAVER LIPIGURD (MISCELLANEOUS) IMPLANT
CLOTH BEACON ORANGE TIMEOUT ST (SAFETY) ×3 IMPLANT
COVER BACK TABLE 60X90IN (DRAPES) ×3 IMPLANT
COVER MAYO STAND STRL (DRAPES) ×3 IMPLANT
COVER SURGICAL LIGHT HANDLE (MISCELLANEOUS) ×3 IMPLANT
DERMABOND ADVANCED .7 DNX12 (GAUZE/BANDAGES/DRESSINGS) ×3 IMPLANT
DEVICE RETRIEVAL ALEXIS 14 (MISCELLANEOUS) IMPLANT
DEVICE SUTURE ENDOST 10MM (ENDOMECHANICALS) IMPLANT
DRAPE SURG IRRIG POUCH 19X23 (DRAPES) ×3 IMPLANT
DURAPREP 26ML APPLICATOR (WOUND CARE) ×3 IMPLANT
EXTRT SYSTEM ALEXIS 14CM (MISCELLANEOUS)
EXTRT SYSTEM ALEXIS 17CM (MISCELLANEOUS)
GAUZE 4X4 16PLY ~~LOC~~+RFID DBL (SPONGE) ×6 IMPLANT
GLOVE BIO SURGEON STRL SZ 6.5 (GLOVE) ×6 IMPLANT
GOWN STRL REUS W/TWL LRG LVL3 (GOWN DISPOSABLE) ×6 IMPLANT
IRRIG SUCT STRYKERFLOW 2 WTIP (MISCELLANEOUS) ×3
IRRIGATION SUCT STRKRFLW 2 WTP (MISCELLANEOUS) ×3 IMPLANT
KIT PINK PAD W/HEAD ARE REST (MISCELLANEOUS) ×3
KIT PINK PAD W/HEAD ARM REST (MISCELLANEOUS) ×3 IMPLANT
KIT TURNOVER CYSTO (KITS) ×3 IMPLANT
MANIPULATOR UTERINE 7CM CLEARV (MISCELLANEOUS) IMPLANT
NS IRRIG 1000ML POUR BTL (IV SOLUTION) ×3 IMPLANT
OCCLUDER COLPOPNEUMO (BALLOONS) ×3 IMPLANT
PACK LAPAROSCOPY BASIN (CUSTOM PROCEDURE TRAY) ×3 IMPLANT
SCISSORS LAP 5X35 DISP (ENDOMECHANICALS) IMPLANT
SET IRRIG Y TYPE TUR BLADDER L (SET/KITS/TRAYS/PACK) IMPLANT
SET TRI-LUMEN FLTR TB AIRSEAL (TUBING) ×3 IMPLANT
SHEARS HARMONIC ACE PLUS 36CM (ENDOMECHANICALS) IMPLANT
SLEEVE SCD COMPRESS KNEE MED (STOCKING) ×3 IMPLANT
SUT ENDO VLOC 180-0-8IN (SUTURE) IMPLANT
SUT PLAIN 2 0 XLH (SUTURE) IMPLANT
SUT VIC AB 0 CT1 27 (SUTURE) ×6
SUT VIC AB 0 CT1 27XBRD ANBCTR (SUTURE) ×6 IMPLANT
SUT VIC AB 2-0 CT1 (SUTURE) IMPLANT
SUT VIC AB 4-0 KS 27 (SUTURE) IMPLANT
SUT VIC AB 4-0 PS2 18 (SUTURE) ×3 IMPLANT
SUT VIC AB 4-0 PS2 27 (SUTURE) ×3 IMPLANT
SUT VICRYL 0 UR6 27IN ABS (SUTURE) ×3 IMPLANT
SYR 10ML LL (SYRINGE) IMPLANT
SYR 50ML LL SCALE MARK (SYRINGE) ×3 IMPLANT
SYSTEM CARTER THOMASON II (TROCAR) ×3 IMPLANT
SYSTEM CONTND EXTRCTN KII BLLN (MISCELLANEOUS) IMPLANT
TIP UTERINE 5.1X6CM LAV DISP (MISCELLANEOUS) IMPLANT
TIP UTERINE 6.7X10CM GRN DISP (MISCELLANEOUS) IMPLANT
TIP UTERINE 6.7X6CM WHT DISP (MISCELLANEOUS) IMPLANT
TIP UTERINE 6.7X8CM BLUE DISP (MISCELLANEOUS) IMPLANT
TOWEL OR 17X24 6PK STRL BLUE (TOWEL DISPOSABLE) ×6 IMPLANT
TRAY FOLEY W/BAG SLVR 14FR LF (SET/KITS/TRAYS/PACK) ×3 IMPLANT
TROCAR PORT AIRSEAL 5X120 (TROCAR) IMPLANT
TROCAR Z-THREAD FIOS 11X100 BL (TROCAR) ×3 IMPLANT
TROCAR Z-THREAD FIOS 5X100MM (TROCAR) ×3 IMPLANT
WARMER LAPAROSCOPE (MISCELLANEOUS) ×3 IMPLANT

## 2023-05-05 NOTE — Interval H&P Note (Signed)
History and Physical Interval Note:  Pt seen and examined.  Reviewed planned procedure and expectations All questions answered To OR when ready   05/05/2023 7:28 AM  Michaela Thomas  has presented today for surgery, with the diagnosis of pai in pelvis.  The various methods of treatment have been discussed with the patient and family. After consideration of risks, benefits and other options for treatment, the patient has consented to  Procedure(s): TOTAL LAPAROSCOPIC HYSTERECTOMY WITH SALPINGECTOMY (Bilateral) CYSTOSCOPY (N/A) as a surgical intervention.  The patient's history has been reviewed, patient examined, no change in status, stable for surgery.  I have reviewed the patient's chart and labs.  Questions were answered to the patient's satisfaction.     Michaela Thomas

## 2023-05-05 NOTE — Anesthesia Postprocedure Evaluation (Signed)
Anesthesia Post Note  Patient: Michaela Thomas  Procedure(s) Performed: TOTAL LAPAROSCOPIC HYSTERECTOMY WITH SALPINGECTOMY (Bilateral: Pelvis) CYSTOSCOPY (Bladder) LAPAROSCOPIC LYSIS OF ADHESIONS (Abdomen)     Patient location during evaluation: PACU Anesthesia Type: General Level of consciousness: sedated, patient cooperative and oriented Pain management: pain level controlled Vital Signs Assessment: post-procedure vital signs reviewed and stable Respiratory status: spontaneous breathing, nonlabored ventilation and respiratory function stable Cardiovascular status: blood pressure returned to baseline and stable Postop Assessment: no apparent nausea or vomiting Anesthetic complications: no   No notable events documented.  Last Vitals:  Vitals:   05/05/23 1130 05/05/23 1152  BP: 126/85 135/89  Pulse: 73   Resp: 13   Temp:  36.6 C  SpO2: 92%     Last Pain:  Vitals:   05/05/23 1152  TempSrc:   PainSc: 6                  Michaela Thomas,E. Almeter Westhoff

## 2023-05-05 NOTE — Anesthesia Procedure Notes (Signed)
Procedure Name: Intubation Date/Time: 05/05/2023 7:44 AM  Performed by: Dairl Ponder, CRNAPre-anesthesia Checklist: Patient identified, Emergency Drugs available, Suction available and Patient being monitored Patient Re-evaluated:Patient Re-evaluated prior to induction Oxygen Delivery Method: Circle System Utilized Preoxygenation: Pre-oxygenation with 100% oxygen Induction Type: IV induction Ventilation: Mask ventilation without difficulty Laryngoscope Size: Mac, 3 and Glidescope (DLx1, grade 4 view, nosuccess. Glidescope 3, intubated without difficulty) Grade View: Grade I Tube type: Oral Tube size: 7.0 mm Number of attempts: 2 Airway Equipment and Method: Stylet and Oral airway Placement Confirmation: ETT inserted through vocal cords under direct vision, positive ETCO2 and breath sounds checked- equal and bilateral Secured at: 22 cm Tube secured with: Tape Dental Injury: Teeth and Oropharynx as per pre-operative assessment

## 2023-05-05 NOTE — Transfer of Care (Signed)
Immediate Anesthesia Transfer of Care Note  Patient: Michaela Thomas  Procedure(s) Performed: TOTAL LAPAROSCOPIC HYSTERECTOMY WITH SALPINGECTOMY (Bilateral: Pelvis) CYSTOSCOPY (Bladder) LAPAROSCOPIC LYSIS OF ADHESIONS (Abdomen)  Patient Location: PACU  Anesthesia Type:General  Level of Consciousness: drowsy and patient cooperative  Airway & Oxygen Therapy: Patient Spontanous Breathing and Patient connected to nasal cannula oxygen  Post-op Assessment: Report given to RN and Post -op Vital signs reviewed and stable  Post vital signs: Reviewed and stable  Last Vitals:  Vitals Value Taken Time  BP 132/93 05/05/23 1003  Temp 36.5 C 05/05/23 1004  Pulse 83 05/05/23 1006  Resp 15 05/05/23 1006  SpO2 95 % 05/05/23 1006  Vitals shown include unfiled device data.  Last Pain:  Vitals:   05/05/23 0619  TempSrc: Oral  PainSc: 0-No pain      Patients Stated Pain Goal: 5 (05/05/23 1610)  Complications: No notable events documented.

## 2023-05-05 NOTE — Discharge Instructions (Signed)
Call office with any concerns [phone] 

## 2023-05-05 NOTE — Op Note (Signed)
Operative Note    Preoperative Diagnosis AUB-L Dysmenorrhea History of uterine ablation    Postoperative Diagnostic: Same  4. 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  This assistant was needed for exposure, dissection, suctioning, retraction, instrument exchange,  and for overall help during the procedure.    Anesthesia: General  Fluids: LR 1.5L EBL: 25ml UOP: Meds: TXA, Ancef 2gm, 0.25% marcaine plain  Findings: Enlarged uterus due to intramural fibroids, suspect adenomyosis, adhesions of bowel to lateral sidewall overlaying uterus on left, adhesions of bowel to lateral sidewall and uterus on left    Specimen: Uterus, cervix, bilateral fallopian tubes to pathology   Procedure Note  Pt seen in pre-op are prior to surgery. Procedure reviewed and all questions answered; consent verified  Pt was taken to the operating room. She was assessed and general anesthesia safely administered.  Pt was then placed in the dorsal lithotomy position with her arms safely positioned at her sides.  Pt was prepped and draped in sterile fashion and a timeout performed.  An exam under anesthesia was performed. A foley catheter was placed in a sterile fashion. Next, a weighted speculum was placed and the anterior lip of the cervix grasped with a single toothed tenaculum.  The uterus was sounded to 6.5cm so a size Lamar Blinks was assembled with a large cup for manipulation. Retention sutures were placed at 3 and 9 o'cloc and the manipulator unit placed Her legs were then lowered and attention turned to her abdomen.  Here a 5mm incision was made at the  umbilicus after injecting with 0.25% plain marcaine. A 5mm optiview trocar and camera were then placed with the abdomen tented upwards. The placement was confirmed to be appropriate pneumoperitoneum was attained with CO2 gas to . The patient was placed in trendelenberg and gross survey of pelvis done.  At this time a 5mm airseal port was then placed under direct visualization in right lower quadrant and an 11mm port site in the left. Care was taken to avoid the epigastric vessels. Both ureters were visualized along lateral sidewalls.  Starting on the the patients left, then her right, the noted adhesions were reduced using the harmonic scalpel. Hemostasis was noted and bowels were intact.  Moving on with better visualization, the left fallopian tube was then grasped, elevated and excised using the harmonic hemostatically. Next the utero-ovarian ligament and the round ligaments were sequentially grasped and excised. The broad ligament was then separated from the uterus sequentially and a bladder flap created.   The cardinal ligament was excised next at the utero-cervical junction and the ovarian vessel clamped, cauterized and cut. The same was done on the patients right by Dr Reina Fuse with similar results.  Next the bladder reflection was dissected away. The vaginal occluder was filled with 60cc of saline and starting anteriorly and working laterally the uterus and cervix were amputated off the superior vagina. Hemostasis was maintained. The uterus, cervix and fallopian tubes were removed.  The pelvis was irrigated and hemostasis noted. The angles of the vaginal cuff were easily seen thus using  a 0 vicryl endostitch  v-lock suture, the cuff was closed in a running fashion with 2-3  back stitches to effect closure.   Further irrigation of the pelvis noted a small bleeding area on the left ovary. This was cauterized. No other bleeding was noted. Thus, the patient was  then flattened. The 11mm port site  was closed with 0-vicryl suture using a carter thomasen device.  The remaining  trocars were removed under direct visualization and gas allowed to escape via the airseal being turned off.  Next, a cystoscopy was performed. Ureteral jets were easily noted from both sides.The cystoscopy was completed  The patients incision sites were closed with 4-0 vicryl suture and dermabond.  All instrument and sponge counts were noted to be accurate per nursing staff x 2 Patient was awakened and taken to recovery room in stable status.

## 2023-05-06 ENCOUNTER — Encounter (HOSPITAL_BASED_OUTPATIENT_CLINIC_OR_DEPARTMENT_OTHER): Payer: Self-pay | Admitting: Obstetrics and Gynecology

## 2023-05-06 DIAGNOSIS — R102 Pelvic and perineal pain: Secondary | ICD-10-CM | POA: Diagnosis not present

## 2023-05-06 DIAGNOSIS — Z8249 Family history of ischemic heart disease and other diseases of the circulatory system: Secondary | ICD-10-CM | POA: Diagnosis not present

## 2023-05-06 DIAGNOSIS — N939 Abnormal uterine and vaginal bleeding, unspecified: Secondary | ICD-10-CM | POA: Diagnosis not present

## 2023-05-06 DIAGNOSIS — F17210 Nicotine dependence, cigarettes, uncomplicated: Secondary | ICD-10-CM | POA: Diagnosis not present

## 2023-05-06 DIAGNOSIS — G8929 Other chronic pain: Secondary | ICD-10-CM | POA: Diagnosis not present

## 2023-05-06 DIAGNOSIS — E119 Type 2 diabetes mellitus without complications: Secondary | ICD-10-CM | POA: Diagnosis not present

## 2023-05-06 DIAGNOSIS — Z833 Family history of diabetes mellitus: Secondary | ICD-10-CM | POA: Diagnosis not present

## 2023-05-06 DIAGNOSIS — K660 Peritoneal adhesions (postprocedural) (postinfection): Secondary | ICD-10-CM | POA: Diagnosis not present

## 2023-05-06 DIAGNOSIS — J4489 Other specified chronic obstructive pulmonary disease: Secondary | ICD-10-CM | POA: Diagnosis not present

## 2023-05-06 DIAGNOSIS — D259 Leiomyoma of uterus, unspecified: Secondary | ICD-10-CM | POA: Diagnosis not present

## 2023-05-06 DIAGNOSIS — I10 Essential (primary) hypertension: Secondary | ICD-10-CM | POA: Diagnosis not present

## 2023-05-06 DIAGNOSIS — N946 Dysmenorrhea, unspecified: Secondary | ICD-10-CM | POA: Diagnosis not present

## 2023-05-06 LAB — CBC
HCT: 32.2 % — ABNORMAL LOW (ref 36.0–46.0)
Hemoglobin: 10.6 g/dL — ABNORMAL LOW (ref 12.0–15.0)
MCH: 28 pg (ref 26.0–34.0)
MCHC: 32.9 g/dL (ref 30.0–36.0)
MCV: 85 fL (ref 80.0–100.0)
Platelets: 384 10*3/uL (ref 150–400)
RBC: 3.79 MIL/uL — ABNORMAL LOW (ref 3.87–5.11)
RDW: 14.4 % (ref 11.5–15.5)
WBC: 17.4 10*3/uL — ABNORMAL HIGH (ref 4.0–10.5)
nRBC: 0 % (ref 0.0–0.2)

## 2023-05-06 MED ORDER — IBUPROFEN 200 MG PO TABS
ORAL_TABLET | ORAL | Status: AC
  Filled 2023-05-06: qty 3

## 2023-05-06 MED ORDER — OXYCODONE HCL 5 MG PO TABS
ORAL_TABLET | ORAL | Status: AC
  Filled 2023-05-06: qty 2

## 2023-05-06 MED ORDER — ACETAMINOPHEN 500 MG PO TABS
ORAL_TABLET | ORAL | Status: AC
  Filled 2023-05-06: qty 2

## 2023-05-06 NOTE — Consult Note (Signed)
  Pt doing well today. Pain 5/10 - just took pain meds. Ambulated , voided and  tolerated PO overnight with no issues. Feels ready for discharge home today  VSS GEN - NAD ABD - soft, mild distension only as expected, tender over LUQ incision, incisions well approx EXT - no homans  17>10.6<14, Gluc - 220  A/P: POD#1 s/p TLH/BS, cystoscopy - stable         Discharge instructions reviewed         Rx sent          Keep visit on 05/19/23

## 2023-05-09 LAB — SURGICAL PATHOLOGY

## 2023-05-13 ENCOUNTER — Other Ambulatory Visit: Payer: Self-pay | Admitting: Family Medicine

## 2023-05-13 DIAGNOSIS — E119 Type 2 diabetes mellitus without complications: Secondary | ICD-10-CM

## 2023-05-16 DIAGNOSIS — E1165 Type 2 diabetes mellitus with hyperglycemia: Secondary | ICD-10-CM | POA: Diagnosis not present

## 2023-05-16 DIAGNOSIS — D649 Anemia, unspecified: Secondary | ICD-10-CM | POA: Diagnosis not present

## 2023-05-16 DIAGNOSIS — Z23 Encounter for immunization: Secondary | ICD-10-CM | POA: Diagnosis not present

## 2023-05-16 DIAGNOSIS — E1159 Type 2 diabetes mellitus with other circulatory complications: Secondary | ICD-10-CM | POA: Diagnosis not present

## 2023-05-20 ENCOUNTER — Emergency Department (HOSPITAL_COMMUNITY)
Admission: EM | Admit: 2023-05-20 | Discharge: 2023-05-21 | Disposition: A | Discharge status: Home / Self Care | Payer: Federal, State, Local not specified - PPO | Attending: Emergency Medicine | Admitting: Emergency Medicine

## 2023-05-20 DIAGNOSIS — D72829 Elevated white blood cell count, unspecified: Secondary | ICD-10-CM | POA: Diagnosis not present

## 2023-05-20 DIAGNOSIS — R911 Solitary pulmonary nodule: Secondary | ICD-10-CM | POA: Diagnosis not present

## 2023-05-20 DIAGNOSIS — K429 Umbilical hernia without obstruction or gangrene: Secondary | ICD-10-CM | POA: Diagnosis not present

## 2023-05-20 DIAGNOSIS — N939 Abnormal uterine and vaginal bleeding, unspecified: Secondary | ICD-10-CM | POA: Insufficient documentation

## 2023-05-20 DIAGNOSIS — T8140XA Infection following a procedure, unspecified, initial encounter: Secondary | ICD-10-CM | POA: Insufficient documentation

## 2023-05-20 DIAGNOSIS — Z90710 Acquired absence of both cervix and uterus: Secondary | ICD-10-CM | POA: Diagnosis not present

## 2023-05-20 DIAGNOSIS — R1032 Left lower quadrant pain: Secondary | ICD-10-CM | POA: Diagnosis not present

## 2023-05-21 ENCOUNTER — Encounter (HOSPITAL_COMMUNITY): Payer: Self-pay

## 2023-05-21 ENCOUNTER — Emergency Department (HOSPITAL_COMMUNITY): Payer: Federal, State, Local not specified - PPO

## 2023-05-21 ENCOUNTER — Other Ambulatory Visit: Payer: Self-pay

## 2023-05-21 DIAGNOSIS — Z90710 Acquired absence of both cervix and uterus: Secondary | ICD-10-CM | POA: Diagnosis not present

## 2023-05-21 DIAGNOSIS — K429 Umbilical hernia without obstruction or gangrene: Secondary | ICD-10-CM | POA: Diagnosis not present

## 2023-05-21 DIAGNOSIS — R1032 Left lower quadrant pain: Secondary | ICD-10-CM | POA: Diagnosis not present

## 2023-05-21 LAB — CBC
HCT: 35.3 % — ABNORMAL LOW (ref 36.0–46.0)
Hemoglobin: 11.4 g/dL — ABNORMAL LOW (ref 12.0–15.0)
MCH: 26.6 pg (ref 26.0–34.0)
MCHC: 32.3 g/dL (ref 30.0–36.0)
MCV: 82.5 fL (ref 80.0–100.0)
Platelets: 510 10*3/uL — ABNORMAL HIGH (ref 150–400)
RBC: 4.28 MIL/uL (ref 3.87–5.11)
RDW: 13.8 % (ref 11.5–15.5)
WBC: 11.5 10*3/uL — ABNORMAL HIGH (ref 4.0–10.5)
nRBC: 0 % (ref 0.0–0.2)

## 2023-05-21 LAB — BASIC METABOLIC PANEL
Anion gap: 10 (ref 5–15)
BUN: 16 mg/dL (ref 6–20)
CO2: 24 mmol/L (ref 22–32)
Calcium: 9.8 mg/dL (ref 8.9–10.3)
Chloride: 98 mmol/L (ref 98–111)
Creatinine, Ser: 0.76 mg/dL (ref 0.44–1.00)
GFR, Estimated: >60 mL/min (ref >60)
Glucose, Bld: 233 mg/dL — ABNORMAL HIGH (ref 70–99)
Potassium: 3.5 mmol/L (ref 3.5–5.1)
Sodium: 132 mmol/L — ABNORMAL LOW (ref 135–145)

## 2023-05-21 MED ORDER — AMOXICILLIN-POT CLAVULANATE 875-125 MG PO TABS: 1.0000 | ORAL_TABLET | Freq: Two times a day (BID) | ORAL | 0 refills | Status: AC

## 2023-05-21 MED ORDER — FLUCONAZOLE 150 MG PO TABS: 150.0000 mg | ORAL_TABLET | Freq: Every day | ORAL | 0 refills | Status: AC

## 2023-05-21 MED ORDER — FENTANYL CITRATE PF 50 MCG/ML IJ SOSY
100.0000 ug | PREFILLED_SYRINGE | Freq: Once | INTRAMUSCULAR | Status: AC
  Administered 2023-05-21: 100 ug via INTRAVENOUS
  Filled 2023-05-21: qty 2

## 2023-05-21 MED ORDER — IOHEXOL 300 MG/ML  SOLN
100.0000 mL | Freq: Once | INTRAMUSCULAR | Status: AC | PRN
  Administered 2023-05-21: 100 mL via INTRAVENOUS

## 2023-05-21 NOTE — ED Notes (Addendum)
12:10 AM  Patient c/o bright red vaginal bleeding that worsened earlier today. Hx hysterectomy x 2 weeks ago. Patient does endorse bleeding through approximately 1 pad/hr. Reports that she did have LLQ abdominal pain 8/10 prior to arrival. Took a 600 mg Ibuprofen at approximately 2000. Rates abdominal pain at 2/10 on pain scale currently. Incision sites free of drainage, bleeding, pus, warmth. She denies fevers, chills. She denies urinary sx, nausea, vomiting, black stools, CP, SOB, dizziness, or lightheadedness. She is alert and oriented x 4. She has equal rise and fall of the chest wall with clear lung sounds. Pending ER provider assessment and orders. Call light in reach. Bed in lowest position.   2:13 AM  Patient transported to CT.   3:23 AM  Discharge instructions discussed with patient. Patient voices understanding of discharge instructions and declines any questions, needs, or concerns regarding discharge. Patient is stable at discharge. Prescriptions discussed with patient using teach-back method. Patient has steady and equal gait. Declines wheelchair at discharge.

## 2023-05-21 NOTE — ED Provider Notes (Signed)
[place] EMERGENCY DEPARTMENT AT [facility] Provider Note   CSN: [id number] Arrival date & time: 05/20/23  2356     History  Chief Complaint  Patient presents with   Vaginal Bleeding    Michaela Thomas is a 47 y.o. female.   Vaginal Bleeding Associated symptoms: no dizziness, no fatigue and no fever   Patient presents for vaginal bleeding.  She reports she underwent total laparoscopic hysterectomy and bilateral salpingectomy on September 12.  She had no immediate complications.  However about 2 days ago she began having abdominal cramping and light vaginal bleeding.  Over the past day she has had increasing vaginal bleeding and passing clots. She is soaking through about 1 pad per hour.  She is not on anticoagulation.  She reports her abdominal cramping is now improving.  No fevers or vomiting.    Home Medications Prior to Admission medications   Medication Sig Start Date End Date Taking? Authorizing Provider  albuterol (PROVENTIL HFA;VENTOLIN HFA) 108 (90 Base) MCG/ACT inhaler Inhale 2 puffs into the lungs every 6 (six) hours as needed for wheezing or shortness of breath. 12/14/17  Yes McManama, Richardson Dopp, FNP  amLODipine (NORVASC) 10 MG tablet Take 10 mg by mouth daily.   Yes [provider]  amoxicillin-clavulanate (AUGMENTIN) 875-125 MG tablet Take 1 tablet by mouth every 12 (twelve) hours. 05/21/23  Yes Zadie Rhine, MD  glipiZIDE (GLUCOTROL) 5 MG tablet TAKE 0.5 TABLET (2.5 MG TOTAL) BY MOUTH DAILY BEFORE BREAKFAST. 05/13/23  Yes Deeann Saint, MD  hydrochlorothiazide (HYDRODIURIL) 25 MG tablet TAKE 1 TABLET BY MOUTH EVERY DAY 09/01/22  Yes Deeann Saint, MD  ibuprofen (ADVIL) 600 MG tablet Take 1 tablet (600 mg total) by mouth every 6 (six) hours as needed for moderate pain or cramping. 05/05/23  Yes Banga, Cecilia Worema, DO  linaclotide (LINZESS) 145 MCG CAPS capsule Take 1 capsule (145 mcg total) by mouth daily before breakfast. 01/13/23  Yes Zehr,  Princella Pellegrini, PA-C  metFORMIN (GLUCOPHAGE-XR) 500 MG 24 hr tablet Take 500 mg by mouth daily with breakfast. 05/16/23  Yes [provider]  ondansetron (ZOFRAN) 4 MG tablet Take 1 tablet (4 mg total) by mouth every 8 (eight) hours as needed for nausea, vomiting or refractory nausea / vomiting. 05/05/23  Yes Banga, Cecilia Worema, DO  oxyCODONE-acetaminophen (PERCOCET/ROXICET) 5-325 MG tablet TAKE 1 TABLET BY MOUTH EVERY 4 (FOUR) HOURS AS NEEDED FOR UP TO 7 DAYS FOR SEVERE PAIN.   Yes [provider]  rosuvastatin (CRESTOR) 10 MG tablet Take 10 mg by mouth daily.   Yes [provider]  Vitamin D, Ergocalciferol, (DRISDOL) 50000 units CAPS capsule Take 1 capsule (50,000 Units total) by mouth every 7 (seven) days. 08/17/16  Yes Fisher, Roselyn Bering, PA-C  cyclobenzaprine (FLEXERIL) 10 MG tablet Take 1 tablet (10 mg total) by mouth 2 (two) times daily as needed for muscle spasms. Patient not taking: Reported on 05/21/2023 04/27/22   Fayrene Helper, PA-C  dicyclomine (BENTYL) 20 MG tablet TAKE 1 TABLET (20 MG TOTAL) BY MOUTH 4 (FOUR) TIMES DAILY - BEFORE MEALS AND AT BEDTIME. Patient not taking: Reported on 05/21/2023 03/04/23   Zehr, Princella Pellegrini, PA-C  omeprazole (PRILOSEC) 20 MG capsule Take 1 capsule (20 mg total) by mouth daily. Patient taking differently: Take 20 mg by mouth as needed. 10/20/22 11/19/22  Terald Sleeper, MD  valACYclovir (VALTREX) 500 MG tablet Take 1 tablet (500 mg total) by mouth 2 (two) times daily as needed. Patient  not taking: Reported on 05/21/2023 07/22/22   Deeann Saint, MD      Allergies    Codeine    Review of Systems   Review of Systems  Constitutional:  Negative for fatigue and fever.  Genitourinary:  Positive for vaginal bleeding.  Neurological:  Negative for dizziness.    Physical Exam Updated Vital Signs BP 121/85 (BP Location: Left Arm)   Pulse 83   Temp 98 F (36.7 C) (Oral)   Resp 14   Ht 1.626 m (5\' 4" )   Wt 97.8 kg   LMP 08/23/2021  (Exact Date) Comment: Patient is unsure if she has gone through an early menopause.  SpO2 99%   BMI 37.01 kg/m  Physical Exam CONSTITUTIONAL: Well developed/well nourished HEAD: Normocephalic/atraumatic EYES: EOMI/PERRL ENMT: Mucous membranes moist NECK: supple no meningeal signs SPINE/BACK:entire spine nontender CV: S1/S2 noted, no murmurs/rubs/gallops noted LUNGS: Lungs are clear to auscultation bilaterally, no apparent distress ABDOMEN: soft, nontender, no rebound or guarding, bowel sounds noted throughout abdomen, no focal tenderness.  Well-healed incisions are noted GU: Female chaperone present for exam.  No vaginal lacerations or visible trauma.  Small amount of blood and clot in the vaginal vault.  No active bleeding is noted NEURO: Pt is awake/alert/appropriate, moves all extremitiesx4.  No facial droop.   EXTREMITIES: pulses normal/equal, full ROM SKIN: warm, color normal PSYCH: no abnormalities of mood noted, alert and oriented to situation  ED Results / Procedures / Treatments   Labs (all labs ordered are listed, but only abnormal results are displayed) Labs Reviewed  CBC - Abnormal; Notable for the following components:      Result Value   WBC 11.5 (*)    Hemoglobin 11.4 (*)    HCT 35.3 (*)    Platelets 510 (*)    All other components within normal limits  BASIC METABOLIC PANEL - Abnormal; Notable for the following components:   Sodium 132 (*)    Glucose, Bld 233 (*)    All other components within normal limits    EKG None  Radiology CT ABDOMEN PELVIS W CONTRAST  Result Date: 05/21/2023 CLINICAL DATA:  Status post hysterectomy 2 weeks ago with vaginal bleeding, worsened earlier today. Left lower quadrant pain. Clinical concern for postop complication or hematoma. EXAM: CT ABDOMEN AND PELVIS WITH CONTRAST TECHNIQUE: Multidetector CT imaging of the abdomen and pelvis was performed using the standard protocol following bolus administration of intravenous contrast.  RADIATION DOSE REDUCTION: This exam was performed according to the departmental dose-optimization program which includes automated exposure control, adjustment of the mA and/or kV according to patient size and/or use of iterative reconstruction technique. CONTRAST:  OMNIPAQUE IOHEXOL 300 MG/ML  SOLN COMPARISON:  01/10/2023. FINDINGS: Lower chest: Mild atelectasis is present at the lung bases. A 3 mm nodule is present in the right lower lobe, axial image 11. Hepatobiliary: No focal liver abnormality is seen. No gallstones, gallbladder wall thickening, or biliary dilatation. Pancreas: Unremarkable. No pancreatic ductal dilatation or surrounding inflammatory changes. Spleen: Normal in size without focal abnormality. Adrenals/Urinary Tract: The adrenal glands are within normal limits. The kidneys enhance symmetrically. No renal calculus or hydronephrosis. The bladder is unremarkable. Stomach/Bowel: Stomach is within normal limits. Appendix appears normal. No evidence of bowel wall thickening, distention, or inflammatory changes. No free air or pneumatosis. Vascular/Lymphatic: No significant vascular findings are present. No enlarged abdominal or pelvic lymph nodes. Reproductive: Status post hysterectomy with fat stranding in the surgical bed. There is a rim enhancing collection in  the adnexal region on the left measuring 3.7 x 2.3 x 4.6 cm. Other: Small amount of free fluid is noted in the pelvis. There is a small fat containing umbilical hernia. Musculoskeletal: No acute osseous abnormality. IMPRESSION: 1. Status post hysterectomy with rim enhancing fluid collection in the left adnexa measuring 3.7 x 2.3 by 4.6 cm, possible abscess. Fat stranding and a small amount of free fluid is noted in the pelvis. 2. 3 mm nodule in the right lower lobe. No follow-up needed if patient is low-risk.This recommendation follows the consensus statement: Guidelines for Management of Incidental Pulmonary Nodules Detected on CT  Images: From the Fleischner Society 2017; Radiology 2017; 284:228-243. Electronically Signed   By: Thornell Sartorius M.D.   On: 05/21/2023 02:31    Procedures Procedures    Medications Ordered in ED Medications  fentaNYL (SUBLIMAZE) injection 100 mcg (100 mcg Intravenous Given 05/21/23 0136)  iohexol (OMNIPAQUE) 300 MG/ML solution 100 mL (100 mLs Intravenous Contrast Given 05/21/23 0208)    ED Course/ Medical Decision Making/ A&P Clinical Course as of 05/21/23 0307  Sat May 21, 2023  0129 Pelvic exam is overall unremarkable except for some mild bleeding and clot formation.  Discussed the case with on-call gynecology Dr. Mindi Slicker She request CT abdomen pelvis and to call her back with results. [DW]  0129 CBC only reveals mild anemia [DW]  0129 Hemoglobin(!): 11.4 [DW]  0228 Glucose(!): 233 Hyperglycemia [DW]  0239 Discussed CT findings with Dr. Mindi Slicker.  No signs of any acute vascular bleeding issues.  However could have an early abscess.  Patient has been afebrile without any other real issues.  White count was less than 12 She request to start Augmentin for a week and she will follow-up with her in 48 hours [DW]  0306 Patient is afebrile here.  She is in no acute distress.  I feel she is safe for discharge home to start antibiotics with close GYN follow-up Bleeding is improving.  Discussed strict ER return precautions including fever over 100, vomiting or worsening pain in the next 48 hours [DW]  0306 Patient also informed of lung nodule.  Patient is a smoker.  I counseled her on stopping smoking.  She will follow-up with repeat CAT scan of her chest in the next 3 months.  She was informed this could be cancer [DW]    Clinical Course User Index [DW] Zadie Rhine, MD                                 Medical Decision Making Amount and/or Complexity of Data Reviewed Labs: ordered. Decision-making details documented in ED Course. Radiology: ordered.  Risk Prescription drug  management.           Final Clinical Impression(s) / ED Diagnoses Final diagnoses:  Vaginal bleeding  Postoperative infection, unspecified type, initial encounter  Lung nodule seen on imaging study    Rx / DC Orders ED Discharge Orders          Ordered    amoxicillin-clavulanate (AUGMENTIN) 875-125 MG tablet  Every 12 hours        05/21/23 0300              Zadie Rhine, MD 05/21/23 [phone]

## 2023-05-21 NOTE — ED Triage Notes (Signed)
Pt states that she had a hysterectomy 2 weeks ago and is now having bleeding with clots.

## 2023-05-26 DIAGNOSIS — R102 Pelvic and perineal pain: Secondary | ICD-10-CM | POA: Diagnosis not present

## 2023-06-21 DIAGNOSIS — N393 Stress incontinence (female) (male): Secondary | ICD-10-CM | POA: Diagnosis not present

## 2023-06-21 DIAGNOSIS — E1165 Type 2 diabetes mellitus with hyperglycemia: Secondary | ICD-10-CM | POA: Diagnosis not present

## 2023-06-21 DIAGNOSIS — E1159 Type 2 diabetes mellitus with other circulatory complications: Secondary | ICD-10-CM | POA: Diagnosis not present

## 2023-06-27 ENCOUNTER — Telehealth: Payer: Self-pay | Admitting: *Deleted

## 2023-06-27 NOTE — Telephone Encounter (Signed)
Transition Care Management Unsuccessful Follow-up Telephone Call  Date of discharge and from where:  [facility]  05/21/2023  Attempts:  1st Attempt  Reason for unsuccessful TCM follow-up call:  No answer/busy

## 2023-06-28 DIAGNOSIS — F32A Depression, unspecified: Secondary | ICD-10-CM | POA: Diagnosis not present

## 2023-06-28 DIAGNOSIS — B009 Herpesviral infection, unspecified: Secondary | ICD-10-CM | POA: Diagnosis not present

## 2023-06-28 DIAGNOSIS — N99820 Postprocedural hemorrhage and hematoma of a genitourinary system organ or structure following a genitourinary system procedure: Secondary | ICD-10-CM | POA: Diagnosis not present

## 2023-07-27 DIAGNOSIS — L089 Local infection of the skin and subcutaneous tissue, unspecified: Secondary | ICD-10-CM | POA: Diagnosis not present

## 2023-07-27 DIAGNOSIS — M79674 Pain in right toe(s): Secondary | ICD-10-CM | POA: Diagnosis not present

## 2023-08-01 DIAGNOSIS — R051 Acute cough: Secondary | ICD-10-CM | POA: Diagnosis not present

## 2023-08-01 DIAGNOSIS — J101 Influenza due to other identified influenza virus with other respiratory manifestations: Secondary | ICD-10-CM | POA: Diagnosis not present

## 2023-08-01 DIAGNOSIS — Z03818 Encounter for observation for suspected exposure to other biological agents ruled out: Secondary | ICD-10-CM | POA: Diagnosis not present

## 2023-08-01 DIAGNOSIS — R062 Wheezing: Secondary | ICD-10-CM | POA: Diagnosis not present

## 2023-08-09 DIAGNOSIS — I152 Hypertension secondary to endocrine disorders: Secondary | ICD-10-CM | POA: Diagnosis not present

## 2023-08-09 DIAGNOSIS — E1159 Type 2 diabetes mellitus with other circulatory complications: Secondary | ICD-10-CM | POA: Diagnosis not present

## 2023-08-09 DIAGNOSIS — Z1231 Encounter for screening mammogram for malignant neoplasm of breast: Secondary | ICD-10-CM | POA: Diagnosis not present

## 2023-08-09 DIAGNOSIS — L03031 Cellulitis of right toe: Secondary | ICD-10-CM | POA: Diagnosis not present

## 2023-08-09 DIAGNOSIS — E1165 Type 2 diabetes mellitus with hyperglycemia: Secondary | ICD-10-CM | POA: Diagnosis not present

## 2023-08-16 ENCOUNTER — Other Ambulatory Visit: Payer: Self-pay | Admitting: Family Medicine

## 2023-08-16 DIAGNOSIS — E119 Type 2 diabetes mellitus without complications: Secondary | ICD-10-CM

## 2023-08-26 ENCOUNTER — Other Ambulatory Visit: Payer: Self-pay | Admitting: Family Medicine

## 2023-08-26 DIAGNOSIS — I10 Essential (primary) hypertension: Secondary | ICD-10-CM

## 2023-10-10 ENCOUNTER — Encounter: Payer: Self-pay | Admitting: Physical Therapy

## 2023-10-10 ENCOUNTER — Ambulatory Visit: Payer: Federal, State, Local not specified - PPO | Attending: Physician Assistant | Admitting: Physical Therapy

## 2023-10-10 ENCOUNTER — Other Ambulatory Visit: Payer: Self-pay

## 2023-10-10 DIAGNOSIS — R279 Unspecified lack of coordination: Secondary | ICD-10-CM | POA: Diagnosis present

## 2023-10-10 DIAGNOSIS — M62838 Other muscle spasm: Secondary | ICD-10-CM | POA: Diagnosis present

## 2023-10-10 DIAGNOSIS — M6281 Muscle weakness (generalized): Secondary | ICD-10-CM | POA: Diagnosis present

## 2023-10-10 DIAGNOSIS — R293 Abnormal posture: Secondary | ICD-10-CM | POA: Insufficient documentation

## 2023-10-10 NOTE — Patient Instructions (Signed)
 Bladder Irritants ? ?Certain foods and beverages can be irritating to the bladder.  Avoiding these irritants may decrease your symptoms of urinary urgency, frequency or bladder pain.  Even reducing your intake can help with your symptoms.  Not everyone is sensitive to all bladder irritants, so you may consider focusing on one irritant at a time, removing or reducing your intake of that irritant for 7-10 days to see if this change helps your symptoms.  Water intake is also very important. ? ?Below is a list of bladder irritants. ? ?Drinks: alcohol, carbonated beverages, caffeinated beverages such as coffee and tea, drinks with artificial sweeteners, citrus juices, apple juice, tomato juice ? ?Foods: tomatoes and tomato based foods, spicy food, sugar and artificial sweeteners, vinegar, chocolate, raw onion, apples, citrus fruits, pineapple, cranberries, tomatoes, strawberries, plums, peaches, cantaloupe ? ?Other: acidic urine (too concentrated) - see water intake info below ? ?Substitutes you can try that are NOT irritating to the bladder: cooked onion, pears, papayas, sun-brewed decaf teas, watermelons, non-citrus herbal teas, apricots, kava and low-acid instant drinks (Postum). ? ? ? ?WATER INTAKE: Remember to drink lots of water (aim for fluid intake of half your body weight with 2/3 of fluids being water).  You may be limiting fluids due to fear of leakage, but this can actually worsen urgency symptoms due to highly concentrated urine.  Water helps balance the pH of your urine so it doesn't become too acidic - acidic urine is a bladder irritant! ? ? ?THE KNACK ? ?The Knack is a strategy you may use to help to reduce or prevent leakage or passing of urine, gas or feces during an activity that causes downward force on the pelvic floor muscles.   ? ?Activities that can cause downward pressure on the pelvic floor muscles include coughing, sneezing, laughing, bending, lifting, and transitioning from different body  positions such as from laying down to sitting up and sitting to standing. ? ?To perform The Knack, consciously squeeze and lift your pelvic floor muscles to perform a strong, well-timed pelvic muscle contraction BEFORE AND DURING these activities above.  As your contraction gets more coordinated and your muscles get stronger, you will become more effective in controlling your experience of incontinence or gas passing during these activities.   ? ?

## 2023-10-10 NOTE — Therapy (Signed)
OUTPATIENT PHYSICAL THERAPY FEMALE PELVIC EVALUATION   Patient Name: Michaela Thomas MRN: [id number] DOB:1975-11-17, 48 y.o., female Today's Date: 10/10/2023  END OF SESSION:  PT End of Session - 10/10/23 1020     Visit Number 1    Date for PT Re-Evaluation 04/08/24    Authorization Type [employer]    PT Start Time 1020   arrival time   PT Stop Time 1059    PT Time Calculation (min) 39 min    Activity Tolerance Patient tolerated treatment well    Behavior During Therapy [facility] for tasks assessed/performed             Past Medical History:  Diagnosis Date   Anxiety 2013   resolved per pt   Asthma    Follows w/ Theora Gianotti, PA.   Back pain    Muscle spasms   Chronic constipation    Follows w/ GI.   Constipation    w/ right lower quadrant pain   DM (diabetes mellitus), type 2 (HCC)    Follows w/ PCP.   Hyperlipidemia    Follows w/ PCP, Theora Gianotti, PA.   Hypertension    Right lower quadrant pain 2024   severe, per pt, unsure if GI related   Wears contact lenses    Wears glasses    Past Surgical History:  Procedure Laterality Date   COLONOSCOPY  06/18/2022   two polyps   COLPOSCOPY  08/24/1999   CYSTOSCOPY N/A 05/05/2023   Procedure: CYSTOSCOPY;  Surgeon: Edwinna Areola, DO;  Location: [facility];  Service: Gynecology;  Laterality: N/A;   ESSURE TUBAL LIGATION  2017   HYSTEROSCOPY WITH D & C Left 12/25/2021   Procedure: DILATATION AND CURETTAGE /HYSTEROSCOPY/DIAGNOSTIC HYSTEROSCOPY UNDER ULTRASOUND GUIDANCE WITH ESSURE REMOVAL;  Surgeon: Edwinna Areola, DO;  Location: [facility];  Service: Gynecology;  Laterality: Left;  removal of essure in uterus   HYSTEROSCOPY WITH NOVASURE  2018   LAPAROSCOPIC LYSIS OF ADHESIONS  05/05/2023   Procedure: LAPAROSCOPIC LYSIS OF ADHESIONS;  Surgeon: Edwinna Areola, DO;  Location: [facility];  Service: Gynecology;;   OPERATIVE ULTRASOUND N/A 12/25/2021   Procedure:  OPERATIVE ULTRASOUND;  Surgeon: Edwinna Areola, DO;  Location: [facility];  Service: Gynecology;  Laterality: N/A;   TOTAL LAPAROSCOPIC HYSTERECTOMY WITH SALPINGECTOMY Bilateral 05/05/2023   Procedure: TOTAL LAPAROSCOPIC HYSTERECTOMY WITH SALPINGECTOMY;  Surgeon: Edwinna Areola, DO;  Location: [facility];  Service: Gynecology;  Laterality: Bilateral;   Patient Active Problem List   Diagnosis Date Noted   Chronic pelvic pain in female 05/05/2023   Post-operative state 05/05/2023   RLQ abdominal pain 01/14/2023   Chronic constipation 01/13/2023   Herpes genitalis 12/22/2022   IBS (irritable bowel syndrome) 12/22/2022   IUD (intrauterine device) in place 12/22/2022   Controlled type 2 diabetes mellitus without complication, without long-term current use of insulin (HCC) 10/11/2022   Essential hypertension 10/11/2022   Herpes simplex 10/08/2013   Smoker 07/17/2012   Family history of breast cancer 07/17/2012   History of abnormal Pap smear 07/17/2012    PCP: Porfirio Oar, PA   REFERRING PROVIDER: Porfirio Oar, PA   REFERRING DIAG: N39.3 (ICD-10-CM) - Stress incontinence (female) (female)  THERAPY DIAG:  Muscle weakness (generalized)  Abnormal posture  Unspecified lack of coordination  Other muscle spasm  Rationale for Evaluation and Treatment: Rehabilitation  ONSET DATE: 2022  SUBJECTIVE:  SUBJECTIVE STATEMENT: Had RSV in 2022 maybe 2023 and was having a very chronic heavy cough. Started having urinary incontinence with coughing and now has urinary incontinence with all coughing and various amounts of leakage will amount occur based on how strong the cough. I feel very embarrassed about this. Only wears pads/leakage panties with cough, and other  times wears no pads.   Fluid intake: water - 32oz most days; coffee sometimes, sugar free sodas 1-2x daily  PAIN:  Are you having pain? Yes NPRS scale: 08/10 Pain location:  low back into glutes   Pain type: aching and sharp Pain description: intermittent   Aggravating factors: standing, exercises, cleaning/cooking usually only a few minutes before pain increases but varies Relieving factors: sitting, meds sometimes, heat some  PRECAUTIONS: None  RED FLAGS: None   WEIGHT BEARING RESTRICTIONS: No  FALLS:  Has patient fallen in last 6 months? No  OCCUPATION: Child psychotherapist  ACTIVITY LEVEL : working out 3-4x weekly  PLOF: Independent  PATIENT GOALS: to stop peeing myself  PERTINENT HISTORY:  Diabetes mellitus without complication, hypertension, and hyperlipidemia.  She has had a tubal ligation. Recent CT scan showed no GI abnormalities pelvic floor dyssnergia, Herpes genitalis, TOTAL LAPAROSCOPIC HYSTERECTOMY WITH SALPINGECTOMY  Sexual abuse: No  BOWEL MOVEMENT: Pain with bowel movement: No Type of bowel movement:Type (Bristol Stool Scale) 4, Frequency daily, and Strain no Fully empty rectum: Yes:   Leakage: No Pads: No Fiber supplement/laxative No  URINATION: Pain with urination: No Fully empty bladder: Yes:   Stream: Strong Urgency: No Frequency: with blood pressure medicine first thing in the morning may go a little quicker but not consistently; not nightly  Leakage: Coughing Pads: Yes: with sickness  INTERCOURSE:  Ability to have vaginal penetration Yes  Pain with intercourse: none DrynessNo Climax: not painful Marinoff Scale: 0/3   PREGNANCY: Vaginal deliveries 2 Tearing Yes:   Episiotomy No C-section deliveries 0 Currently pregnant No  PROLAPSE: None   OBJECTIVE:  Note: Objective measures were completed at Evaluation unless otherwise noted.  DIAGNOSTIC FINDINGS:    COGNITION: Overall cognitive status: Within functional limits for  tasks assessed     SENSATION: Light touch: Appears intact  LUMBAR SPECIAL TESTS:  SI Compression/distraction test: Negative, and FABER test: Negative  FUNCTIONAL TESTS:  Functional squat - slight bil valgus with return to stand and decreased limited  by 25%  GAIT: WFL  POSTURE: rounded shoulders and anterior pelvic tilt   LUMBARAROM/PROM:  A/PROM A/PROM  eval  Flexion WFL  Extension WFL  Right lateral flexion Limited by 25%  Left lateral flexion Limited by 25%  Right rotation Limited by 25%  Left rotation Limited by 25%   (Blank rows = not tested)  LOWER EXTREMITY ROM:  WFL  LOWER EXTREMITY MMT: Bil hips grossly 4+/5; knees 5/5  PALPATION:   General: tight lumbar paraspinals   Pelvic Alignment: WFL  Abdominal: mild fascial restrictions in lower abdominal quadrants but no TTP                External Perineal Exam: mild dryness noted but no pain                             Internal Pelvic Floor: no TTP, WFL  Patient confirms identification and approves PT to assess internal pelvic floor and treatment Yes No emotional/communication barriers or cognitive limitation. Patient is motivated to learn. Patient understands and agrees with treatment goals and plan.  PT explains patient will be examined in standing, sitting, and lying down to see how their muscles and joints work. When they are ready, they will be asked to remove their underwear so PT can examine their perineum. The patient is also given the option of providing their own chaperone as one is not provided in our facility. The patient also has the right and is explained the right to defer or refuse any part of the evaluation or treatment including the internal exam. With the patient's consent, PT will use one gloved finger to gently assess the muscles of the pelvic floor, seeing how well it contracts and relaxes and if there is muscle symmetry. After, the patient will get dressed and PT and patient will discuss exam  findings and plan of care. PT and patient discuss plan of care, schedule, attendance policy and HEP activities.   PELVIC MMT:   MMT eval  Vaginal 3/5, 10s, 4 reps  Internal Anal Sphincter   External Anal Sphincter   Puborectalis   Diastasis Recti   (Blank rows = not tested)        TONE: WFL  PROLAPSE: Mild anterior wall mobility felt with cough but not visualized during assessment in hooklying   TODAY'S TREATMENT:                                                                                                                              DATE:   10/10/23 EVAL Examination completed, findings reviewed, pt educated on POC, HEP. Pt motivated to participate in PT and agreeable to attempt recommendations.     PATIENT EDUCATION:  Education details: [email or web address] Person educated: Patient Education method: Programmer, multimedia, Demonstration, Actor cues, Verbal cues, and Handouts Education comprehension: verbalized understanding, returned demonstration, verbal cues required, tactile cues required, and needs further education  HOME EXERCISE PROGRAM: Access Code: [email or web address] URL: [email or web address] Date: 10/10/2023 Prepared by: [facility]  Exercises - Seated Diaphragmatic Breathing  - 1 x daily - 7 x weekly - 1 sets - 10 reps - Seated Pelvic Floor Contraction  - 1 x daily - 7 x weekly - 1 sets - 10 reps - Seated Cough with Pelvic Floor Contraction and Hand to Mouth  - 1 x daily - 7 x weekly - 1 sets - 10 reps - Seated Pelvic Floor Muscles Isometrics on Towel Roll  - 1 x daily - 7 x weekly - 1 sets - 10 reps - 10s holds - Seated Quick Flick Pelvic Floor Contractions  - 1 x daily - 7 x weekly - 2 sets - 10 reps  ASSESSMENT:  CLINICAL IMPRESSION: Patient is a 48 y.o. female  who was seen today for physical therapy evaluation and treatment for stress urinary incontinence. Pt has urinary incontinence with coughing only and has had up to  full bladder losses with this. Pt reports she feels limited in her ability to travel longer distances or needs to  think about community outings with bringing a pad or clothes in case of leakage or needs to be aware of bathrooms. Pt found to have decreased flexibility in spine an hips, decreased core and hip strength, and fascial restrictions in lower abdomen. Patient consented to internal pelvic floor assessment vaginally this date and found to have decreased strength, endurance, and coordination. Patient benefited from verbal cues for improved technique with pelvic floor contractions and coordination if pelvic floor with breathing. Pt would benefit from additional PT to further address deficits.    OBJECTIVE IMPAIRMENTS: decreased activity tolerance, decreased coordination, decreased endurance, decreased mobility, decreased strength, increased fascial restrictions, increased muscle spasms, impaired flexibility, improper body mechanics, postural dysfunction, and pain.   ACTIVITY LIMITATIONS: standing, squatting, and continence  PARTICIPATION LIMITATIONS: community activity  PERSONAL FACTORS: Time since onset of injury/illness/exacerbation are also affecting patient's functional outcome.   REHAB POTENTIAL: Good  CLINICAL DECISION MAKING: Stable/uncomplicated  EVALUATION COMPLEXITY: Low   GOALS: Goals reviewed with patient? Yes  SHORT TERM GOALS: Target date: 11/07/23  Pt to be I with HEP.  Baseline: Goal status: INITIAL  2.  Pt to be I with knack and pressure management to decreased strain at pelvic floor and urinary incontinence.  Baseline:  Goal status: INITIAL  3.  Pt to report improvement of urinary incontinence with no more than 1 instance of leakage in a day for improved QOL and confidence of community outings. Baseline:  Goal status: INITIAL    LONG TERM GOALS: Target date: 04/08/24  Pt to be I with advanced HEP.  Baseline:  Goal status: INITIAL  2.  Pt to demonstrate at  least 4/5 pelvic floor strength for improved pelvic stability and decreased strain at pelvic floor/ decrease leakage.  Baseline:  Goal status: INITIAL  3.  Pt to report no more than 1 instance of urinary incontinence with stressors in a week for improved QOL.  Baseline:  Goal status: INITIAL  4.  Pt to demonstrate at least 5/5 bil hip strength for improved pelvic stability and functional squats without leakage.  Baseline:  Goal status: INITIAL  5.  Pt to demonstrate ability to complete x10 10# squats without compensatory strategies without leakage or increased back pain.   Baseline:  Goal status: INITIAL PLAN:  PT FREQUENCY: 1-2x/week  PT DURATION:  8 sessions  PLANNED INTERVENTIONS: 97110-Therapeutic exercises, 97530- Therapeutic activity, 97112- Neuromuscular re-education, 97535- Self Care, [id number]- Manual therapy, Patient/Family education, Taping, Dry Needling, Joint mobilization, Spinal mobilization, Scar mobilization, DME instructions, Cryotherapy, Moist heat, and Biofeedback  PLAN FOR NEXT SESSION: core and hip strengthening, coordination of pelvic floor and exercises, stretching backs and hips  Otelia Sergeant, PT, DPT 10/09/2510:25 PM

## 2023-10-12 ENCOUNTER — Ambulatory Visit: Payer: Federal, State, Local not specified - PPO | Admitting: Physical Therapy

## 2023-11-02 ENCOUNTER — Ambulatory Visit: Payer: Federal, State, Local not specified - PPO | Attending: Physician Assistant | Admitting: Physical Therapy

## 2023-11-02 DIAGNOSIS — M6281 Muscle weakness (generalized): Secondary | ICD-10-CM | POA: Insufficient documentation

## 2023-11-02 DIAGNOSIS — M62838 Other muscle spasm: Secondary | ICD-10-CM | POA: Insufficient documentation

## 2023-11-02 DIAGNOSIS — R279 Unspecified lack of coordination: Secondary | ICD-10-CM | POA: Insufficient documentation

## 2023-11-02 DIAGNOSIS — R293 Abnormal posture: Secondary | ICD-10-CM | POA: Insufficient documentation

## 2023-11-09 ENCOUNTER — Ambulatory Visit: Payer: Federal, State, Local not specified - PPO | Admitting: Physical Therapy

## 2023-11-09 DIAGNOSIS — R279 Unspecified lack of coordination: Secondary | ICD-10-CM

## 2023-11-09 DIAGNOSIS — R293 Abnormal posture: Secondary | ICD-10-CM

## 2023-11-09 DIAGNOSIS — M6281 Muscle weakness (generalized): Secondary | ICD-10-CM

## 2023-11-09 DIAGNOSIS — M62838 Other muscle spasm: Secondary | ICD-10-CM | POA: Diagnosis present

## 2023-11-09 NOTE — Therapy (Signed)
 OUTPATIENT PHYSICAL THERAPY FEMALE PELVIC EVALUATION   Patient Name: Michaela Thomas MRN: [id number] DOB:1976-08-21, 48 y.o., female Today's Date: 11/09/2023  END OF SESSION:  PT End of Session - 11/09/23 1616     Visit Number 2    Date for PT Re-Evaluation 04/08/24    Authorization Type BC[employer]    PT Start Time 1616    PT Stop Time 1654    PT Time Calculation (min) 38 min    Activity Tolerance Patient tolerated treatment well    Behavior During Therapy [facility] for tasks assessed/performed             Past Medical History:  Diagnosis Date   Anxiety 2013   resolved per pt   Asthma    Follows w/ Theora Gianotti, PA.   Back pain    Muscle spasms   Chronic constipation    Follows w/ GI.   Constipation    w/ right lower quadrant pain   DM (diabetes mellitus), type 2 (HCC)    Follows w/ PCP.   Hyperlipidemia    Follows w/ PCP, Theora Gianotti, PA.   Hypertension    Right lower quadrant pain 2024   severe, per pt, unsure if GI related   Wears contact lenses    Wears glasses    Past Surgical History:  Procedure Laterality Date   COLONOSCOPY  06/18/2022   two polyps   COLPOSCOPY  08/24/1999   CYSTOSCOPY N/A 05/05/2023   Procedure: CYSTOSCOPY;  Surgeon: Edwinna Areola, DO;  Location: [facility];  Service: Gynecology;  Laterality: N/A;   ESSURE TUBAL LIGATION  2017   HYSTEROSCOPY WITH D & C Left 12/25/2021   Procedure: DILATATION AND CURETTAGE /HYSTEROSCOPY/DIAGNOSTIC HYSTEROSCOPY UNDER ULTRASOUND GUIDANCE WITH ESSURE REMOVAL;  Surgeon: Edwinna Areola, DO;  Location: [facility];  Service: Gynecology;  Laterality: Left;  removal of essure in uterus   HYSTEROSCOPY WITH NOVASURE  2018   LAPAROSCOPIC LYSIS OF ADHESIONS  05/05/2023   Procedure: LAPAROSCOPIC LYSIS OF ADHESIONS;  Surgeon: Edwinna Areola, DO;  Location: [facility];  Service: Gynecology;;   OPERATIVE ULTRASOUND N/A 12/25/2021   Procedure: OPERATIVE  ULTRASOUND;  Surgeon: Edwinna Areola, DO;  Location: [facility];  Service: Gynecology;  Laterality: N/A;   TOTAL LAPAROSCOPIC HYSTERECTOMY WITH SALPINGECTOMY Bilateral 05/05/2023   Procedure: TOTAL LAPAROSCOPIC HYSTERECTOMY WITH SALPINGECTOMY;  Surgeon: Edwinna Areola, DO;  Location: [facility];  Service: Gynecology;  Laterality: Bilateral;   Patient Active Problem List   Diagnosis Date Noted   Chronic pelvic pain in female 05/05/2023   Post-operative state 05/05/2023   RLQ abdominal pain 01/14/2023   Chronic constipation 01/13/2023   Herpes genitalis 12/22/2022   IBS (irritable bowel syndrome) 12/22/2022   IUD (intrauterine device) in place 12/22/2022   Controlled type 2 diabetes mellitus without complication, without long-term current use of insulin (HCC) 10/11/2022   Essential hypertension 10/11/2022   Herpes simplex 10/08/2013   Smoker 07/17/2012   Family history of breast cancer 07/17/2012   History of abnormal Pap smear 07/17/2012    PCP: Porfirio Oar, PA   REFERRING PROVIDER: Porfirio Oar, PA   REFERRING DIAG: N39.3 (ICD-10-CM) - Stress incontinence (female) (female)  THERAPY DIAG:  Muscle weakness (generalized)  Unspecified lack of coordination  Abnormal posture  Other muscle spasm  Rationale for Evaluation and Treatment: Rehabilitation  ONSET DATE: 2022  SUBJECTIVE:  SUBJECTIVE STATEMENT: Pt reports she hasn't had any leakage since eval. Has started to have a little cough but hasn't had leakage so far.   Fluid intake: water - 32oz most days; coffee sometimes, sugar free sodas 1-2x daily  PAIN:  Are you having pain? Yes NPRS scale: 02/10 Pain location:  Rt knee   Pain type: aching  Pain description: intermittent    Aggravating factors: varies Relieving factors: sitting, meds sometimes, heat some  PRECAUTIONS: None  RED FLAGS: None   WEIGHT BEARING RESTRICTIONS: No  FALLS:  Has patient fallen in last 6 months? No  OCCUPATION: Child psychotherapist  ACTIVITY LEVEL : working out 3-4x weekly  PLOF: Independent  PATIENT GOALS: to stop peeing myself  PERTINENT HISTORY:  Diabetes mellitus without complication, hypertension, and hyperlipidemia.  She has had a tubal ligation. Recent CT scan showed no GI abnormalities pelvic floor dyssnergia, Herpes genitalis, TOTAL LAPAROSCOPIC HYSTERECTOMY WITH SALPINGECTOMY  Sexual abuse: No  BOWEL MOVEMENT: Pain with bowel movement: No Type of bowel movement:Type (Bristol Stool Scale) 4, Frequency daily, and Strain no Fully empty rectum: Yes:   Leakage: No Pads: No Fiber supplement/laxative No  URINATION: Pain with urination: No Fully empty bladder: Yes:   Stream: Strong Urgency: No Frequency: with blood pressure medicine first thing in the morning may go a little quicker but not consistently; not nightly  Leakage: Coughing Pads: Yes: with sickness  INTERCOURSE:  Ability to have vaginal penetration Yes  Pain with intercourse: none DrynessNo Climax: not painful Marinoff Scale: 0/3   PREGNANCY: Vaginal deliveries 2 Tearing Yes:   Episiotomy No C-section deliveries 0 Currently pregnant No  PROLAPSE: None   OBJECTIVE:  Note: Objective measures were completed at Evaluation unless otherwise noted.  DIAGNOSTIC FINDINGS:    COGNITION: Overall cognitive status: Within functional limits for tasks assessed     SENSATION: Light touch: Appears intact  LUMBAR SPECIAL TESTS:  SI Compression/distraction test: Negative, and FABER test: Negative  FUNCTIONAL TESTS:  Functional squat - slight bil valgus with return to stand and decreased limited  by 25%  GAIT: WFL  POSTURE: rounded shoulders and anterior pelvic  tilt   LUMBARAROM/PROM:  A/PROM A/PROM  eval  Flexion WFL  Extension WFL  Right lateral flexion Limited by 25%  Left lateral flexion Limited by 25%  Right rotation Limited by 25%  Left rotation Limited by 25%   (Blank rows = not tested)  LOWER EXTREMITY ROM:  WFL  LOWER EXTREMITY MMT: Bil hips grossly 4+/5; knees 5/5  PALPATION:   General: tight lumbar paraspinals   Pelvic Alignment: WFL  Abdominal: mild fascial restrictions in lower abdominal quadrants but no TTP                External Perineal Exam: mild dryness noted but no pain                             Internal Pelvic Floor: no TTP, WFL  Patient confirms identification and approves PT to assess internal pelvic floor and treatment Yes No emotional/communication barriers or cognitive limitation. Patient is motivated to learn. Patient understands and agrees with treatment goals and plan. PT explains patient will be examined in standing, sitting, and lying down to see how their muscles and joints work. When they are ready, they will be asked to remove their underwear so PT can examine their perineum. The patient is also given the option of providing their own chaperone as one is  not provided in our facility. The patient also has the right and is explained the right to defer or refuse any part of the evaluation or treatment including the internal exam. With the patient's consent, PT will use one gloved finger to gently assess the muscles of the pelvic floor, seeing how well it contracts and relaxes and if there is muscle symmetry. After, the patient will get dressed and PT and patient will discuss exam findings and plan of care. PT and patient discuss plan of care, schedule, attendance policy and HEP activities.   PELVIC MMT:   MMT eval  Vaginal 3/5, 10s, 4 reps  Internal Anal Sphincter   External Anal Sphincter   Puborectalis   Diastasis Recti   (Blank rows = not tested)        TONE: WFL  PROLAPSE: Mild anterior  wall mobility felt with cough but not visualized during assessment in hooklying   TODAY'S TREATMENT:                                                                                                                              DATE:   10/10/23 EVAL Examination completed, findings reviewed, pt educated on POC, HEP. Pt motivated to participate in PT and agreeable to attempt recommendations.    11/09/23: NMRE: all exercises for exhale and pelvic floor contraction with activity for improved coordination of muscle activation and decreased stress at pelvic floor for decreased leakage.  Opp hand/knee ball press 2x10 Bridges 2x10 Sidelying hip abduction red loop 2x10 Sidelying reverse clam red loop 2x10 Bird dogs x10 - moderate cues for transverse abdominis activation 2x10 Sit to stand from mat Alt mario punches x10 each Farmers carry 10# 200' x2 each hand   PATIENT EDUCATION:  Education details: [email or web address] Person educated: Patient Education method: Explanation, Demonstration, Tactile cues, Verbal cues, and Handouts Education comprehension: verbalized understanding, returned demonstration, verbal cues required, tactile cues required, and needs further education  HOME EXERCISE PROGRAM: Access Code: [email or web address] URL: [email or web address] Date: 10/10/2023 Prepared by: [facility]  Exercises - Seated Diaphragmatic Breathing  - 1 x daily - 7 x weekly - 1 sets - 10 reps - Seated Pelvic Floor Contraction  - 1 x daily - 7 x weekly - 1 sets - 10 reps - Seated Cough with Pelvic Floor Contraction and Hand to Mouth  - 1 x daily - 7 x weekly - 1 sets - 10 reps - Seated Pelvic Floor Muscles Isometrics on Towel Roll  - 1 x daily - 7 x weekly - 1 sets - 10 reps - 10s holds - Seated Quick Flick Pelvic Floor Contractions  - 1 x daily - 7 x weekly - 2 sets - 10 reps  ASSESSMENT:  CLINICAL IMPRESSION: Patient is a 48 y.o. female  who was seen today for  physical therapy evaluation and treatment for stress urinary incontinence. Pt reports she had had good response to therapy  since eval with no leakage since eval. Pt reports she had minimal coughing since then. Session focused on hip and core strengthening exercises with coordination of pelvic floor. Pt tolerated well and denied leakage throughout. Pt would benefit from additional PT to further address deficits.    OBJECTIVE IMPAIRMENTS: decreased activity tolerance, decreased coordination, decreased endurance, decreased mobility, decreased strength, increased fascial restrictions, increased muscle spasms, impaired flexibility, improper body mechanics, postural dysfunction, and pain.   ACTIVITY LIMITATIONS: standing, squatting, and continence  PARTICIPATION LIMITATIONS: community activity  PERSONAL FACTORS: Time since onset of injury/illness/exacerbation are also affecting patient's functional outcome.   REHAB POTENTIAL: Good  CLINICAL DECISION MAKING: Stable/uncomplicated  EVALUATION COMPLEXITY: Low   GOALS: Goals reviewed with patient? Yes  SHORT TERM GOALS: Target date: 11/07/23  Pt to be I with HEP.  Baseline: Goal status: on going  2.  Pt to be I with knack and pressure management to decreased strain at pelvic floor and urinary incontinence.  Baseline:  Goal status: MET  3.  Pt to report improvement of urinary incontinence with no more than 1 instance of leakage in a day for improved QOL and confidence of community outings. Baseline:  Goal status: MET    LONG TERM GOALS: Target date: 04/08/24  Pt to be I with advanced HEP.  Baseline:  Goal status: on going  2.  Pt to demonstrate at least 4/5 pelvic floor strength for improved pelvic stability and decreased strain at pelvic floor/ decrease leakage.  Baseline:  Goal status: on going  3.  Pt to report no more than 1 instance of urinary incontinence with stressors in a week for improved QOL.  Baseline:  Goal status: on  going  4.  Pt to demonstrate at least 5/5 bil hip strength for improved pelvic stability and functional squats without leakage.  Baseline:  Goal status: on going  5.  Pt to demonstrate ability to complete x10 10# squats without compensatory strategies without leakage or increased back pain.   Baseline:  Goal status: on going PLAN:  PT FREQUENCY: 1-2x/week  PT DURATION:  8 sessions  PLANNED INTERVENTIONS: 97110-Therapeutic exercises, 97530- Therapeutic activity, 97112- Neuromuscular re-education, 97535- Self Care, [id number]- Manual therapy, Patient/Family education, Taping, Dry Needling, Joint mobilization, Spinal mobilization, Scar mobilization, DME instructions, Cryotherapy, Moist heat, and Biofeedback  PLAN FOR NEXT SESSION: core and hip strengthening, coordination of pelvic floor and exercises, stretching backs and hips  Otelia Sergeant, PT, DPT 03/19/254:56 PM

## 2023-11-16 ENCOUNTER — Ambulatory Visit: Payer: Federal, State, Local not specified - PPO | Admitting: Physical Therapy

## 2023-11-16 DIAGNOSIS — R293 Abnormal posture: Secondary | ICD-10-CM

## 2023-11-16 DIAGNOSIS — M6281 Muscle weakness (generalized): Secondary | ICD-10-CM

## 2023-11-16 DIAGNOSIS — R279 Unspecified lack of coordination: Secondary | ICD-10-CM

## 2023-11-16 NOTE — Therapy (Signed)
 OUTPATIENT PHYSICAL THERAPY FEMALE PELVIC EVALUATION   Patient Name: Michaela Thomas MRN: [id number] DOB:09/29/1975, 48 y.o., female Today's Date: 11/16/2023  END OF SESSION:  PT End of Session - 11/16/23 1626     Visit Number 3    Date for PT Re-Evaluation 04/08/24    Authorization Type [employer]    PT Start Time 1623   arrival   PT Stop Time 1647   requests to end session early to make to next appointment   PT Time Calculation (min) 24 min    Activity Tolerance Patient tolerated treatment well    Behavior During Therapy [facility] for tasks assessed/performed             Past Medical History:  Diagnosis Date   Anxiety 2013   resolved per pt   Asthma    Follows w/ Theora Gianotti, PA.   Back pain    Muscle spasms   Chronic constipation    Follows w/ GI.   Constipation    w/ right lower quadrant pain   DM (diabetes mellitus), type 2 (HCC)    Follows w/ PCP.   Hyperlipidemia    Follows w/ PCP, Theora Gianotti, PA.   Hypertension    Right lower quadrant pain 2024   severe, per pt, unsure if GI related   Wears contact lenses    Wears glasses    Past Surgical History:  Procedure Laterality Date   COLONOSCOPY  06/18/2022   two polyps   COLPOSCOPY  08/24/1999   CYSTOSCOPY N/A 05/05/2023   Procedure: CYSTOSCOPY;  Surgeon: Edwinna Areola, DO;  Location: [facility];  Service: Gynecology;  Laterality: N/A;   ESSURE TUBAL LIGATION  2017   HYSTEROSCOPY WITH D & C Left 12/25/2021   Procedure: DILATATION AND CURETTAGE /HYSTEROSCOPY/DIAGNOSTIC HYSTEROSCOPY UNDER ULTRASOUND GUIDANCE WITH ESSURE REMOVAL;  Surgeon: Edwinna Areola, DO;  Location: [facility];  Service: Gynecology;  Laterality: Left;  removal of essure in uterus   HYSTEROSCOPY WITH NOVASURE  2018   LAPAROSCOPIC LYSIS OF ADHESIONS  05/05/2023   Procedure: LAPAROSCOPIC LYSIS OF ADHESIONS;  Surgeon: Edwinna Areola, DO;  Location: [facility];  Service: Gynecology;;    OPERATIVE ULTRASOUND N/A 12/25/2021   Procedure: OPERATIVE ULTRASOUND;  Surgeon: Edwinna Areola, DO;  Location: [facility];  Service: Gynecology;  Laterality: N/A;   TOTAL LAPAROSCOPIC HYSTERECTOMY WITH SALPINGECTOMY Bilateral 05/05/2023   Procedure: TOTAL LAPAROSCOPIC HYSTERECTOMY WITH SALPINGECTOMY;  Surgeon: Edwinna Areola, DO;  Location: [facility];  Service: Gynecology;  Laterality: Bilateral;   Patient Active Problem List   Diagnosis Date Noted   Chronic pelvic pain in female 05/05/2023   Post-operative state 05/05/2023   RLQ abdominal pain 01/14/2023   Chronic constipation 01/13/2023   Herpes genitalis 12/22/2022   IBS (irritable bowel syndrome) 12/22/2022   IUD (intrauterine device) in place 12/22/2022   Controlled type 2 diabetes mellitus without complication, without long-term current use of insulin (HCC) 10/11/2022   Essential hypertension 10/11/2022   Herpes simplex 10/08/2013   Smoker 07/17/2012   Family history of breast cancer 07/17/2012   History of abnormal Pap smear 07/17/2012    PCP: Porfirio Oar, PA   REFERRING PROVIDER: Porfirio Oar, PA   REFERRING DIAG: N39.3 (ICD-10-CM) - Stress incontinence (female) (female)  THERAPY DIAG:  Muscle weakness (generalized)  Unspecified lack of coordination  Abnormal posture  Rationale for Evaluation and Treatment: Rehabilitation  ONSET DATE: 2022  SUBJECTIVE:  SUBJECTIVE STATEMENT: Has been having coughing and urinary incontinence only with this. Does report she has had improvement overall and having much less leakage with coughing now.   Fluid intake: water - 32oz most days; coffee sometimes, sugar free sodas 1-2x daily  PAIN:  Are you having pain? Yes NPRS scale: 02/10 Pain  location:  Rt knee   Pain type: aching  Pain description: intermittent   Aggravating factors: varies Relieving factors: sitting, meds sometimes, heat some  PRECAUTIONS: None  RED FLAGS: None   WEIGHT BEARING RESTRICTIONS: No  FALLS:  Has patient fallen in last 6 months? No  OCCUPATION: Child psychotherapist  ACTIVITY LEVEL : working out 3-4x weekly  PLOF: Independent  PATIENT GOALS: to stop peeing myself  PERTINENT HISTORY:  Diabetes mellitus without complication, hypertension, and hyperlipidemia.  She has had a tubal ligation. Recent CT scan showed no GI abnormalities pelvic floor dyssnergia, Herpes genitalis, TOTAL LAPAROSCOPIC HYSTERECTOMY WITH SALPINGECTOMY  Sexual abuse: No  BOWEL MOVEMENT: Pain with bowel movement: No Type of bowel movement:Type (Bristol Stool Scale) 4, Frequency daily, and Strain no Fully empty rectum: Yes:   Leakage: No Pads: No Fiber supplement/laxative No  URINATION: Pain with urination: No Fully empty bladder: Yes:   Stream: Strong Urgency: No Frequency: with blood pressure medicine first thing in the morning may go a little quicker but not consistently; not nightly  Leakage: Coughing Pads: Yes: with sickness  INTERCOURSE:  Ability to have vaginal penetration Yes  Pain with intercourse: none DrynessNo Climax: not painful Marinoff Scale: 0/3   PREGNANCY: Vaginal deliveries 2 Tearing Yes:   Episiotomy No C-section deliveries 0 Currently pregnant No  PROLAPSE: None   OBJECTIVE:  Note: Objective measures were completed at Evaluation unless otherwise noted.  DIAGNOSTIC FINDINGS:    COGNITION: Overall cognitive status: Within functional limits for tasks assessed     SENSATION: Light touch: Appears intact  LUMBAR SPECIAL TESTS:  SI Compression/distraction test: Negative, and FABER test: Negative  FUNCTIONAL TESTS:  Functional squat - slight bil valgus with return to stand and decreased limited  by  25%  GAIT: WFL  POSTURE: rounded shoulders and anterior pelvic tilt   LUMBARAROM/PROM:  A/PROM A/PROM  eval  Flexion WFL  Extension WFL  Right lateral flexion Limited by 25%  Left lateral flexion Limited by 25%  Right rotation Limited by 25%  Left rotation Limited by 25%   (Blank rows = not tested)  LOWER EXTREMITY ROM:  WFL  LOWER EXTREMITY MMT: Bil hips grossly 4+/5; knees 5/5  PALPATION:   General: tight lumbar paraspinals   Pelvic Alignment: WFL  Abdominal: mild fascial restrictions in lower abdominal quadrants but no TTP                External Perineal Exam: mild dryness noted but no pain                             Internal Pelvic Floor: no TTP, WFL  Patient confirms identification and approves PT to assess internal pelvic floor and treatment Yes No emotional/communication barriers or cognitive limitation. Patient is motivated to learn. Patient understands and agrees with treatment goals and plan. PT explains patient will be examined in standing, sitting, and lying down to see how their muscles and joints work. When they are ready, they will be asked to remove their underwear so PT can examine their perineum. The patient is also given the option of providing their own chaperone  as one is not provided in our facility. The patient also has the right and is explained the right to defer or refuse any part of the evaluation or treatment including the internal exam. With the patient's consent, PT will use one gloved finger to gently assess the muscles of the pelvic floor, seeing how well it contracts and relaxes and if there is muscle symmetry. After, the patient will get dressed and PT and patient will discuss exam findings and plan of care. PT and patient discuss plan of care, schedule, attendance policy and HEP activities.   PELVIC MMT:   MMT eval  Vaginal 3/5, 10s, 4 reps  Internal Anal Sphincter   External Anal Sphincter   Puborectalis   Diastasis Recti   (Blank  rows = not tested)        TONE: WFL  PROLAPSE: Mild anterior wall mobility felt with cough but not visualized during assessment in hooklying   TODAY'S TREATMENT:                                                                                                                              DATE:   10/10/23 EVAL Examination completed, findings reviewed, pt educated on POC, HEP. Pt motivated to participate in PT and agreeable to attempt recommendations.    11/09/23: NMRE: all exercises for exhale and pelvic floor contraction with activity for improved coordination of muscle activation and decreased stress at pelvic floor for decreased leakage.  Opp hand/knee ball press 2x10 Bridges 2x10 Sidelying hip abduction red loop 2x10 Sidelying reverse clam red loop 2x10 Bird dogs x10 - moderate cues for transverse abdominis activation 2x10 Sit to stand from mat Alt mario punches x10 each Farmers carry 10# 200' x2 each hand   11/16/23: NMRE: all exercises for exhale and pelvic floor contraction with activity for improved coordination of muscle activation and decreased stress at pelvic floor for decreased leakage.   2x10 hooklying opp hand/knee ball press X5 modified single leg Sit to stand  2x10 seated bil shoulder horizontal abduction small power cord Mario punches 8# 2x10 Wall mountain climbers 2x10  Bosu quick step ups x10 each Standing pelvic floor contractions with mini coughs  PATIENT EDUCATION:  Education details: [email or web address] Person educated: Patient Education method: Programmer, multimedia, Facilities manager, Actor cues, Verbal cues, and Handouts Education comprehension: verbalized understanding, returned demonstration, verbal cues required, tactile cues required, and needs further education  HOME EXERCISE PROGRAM: Access Code: [email or web address] URL: [email or web address] Date: 10/10/2023 Prepared by: [facility] - Outpatient Rehab - [facility]  Exercises - Seated  Diaphragmatic Breathing  - 1 x daily - 7 x weekly - 1 sets - 10 reps - Seated Pelvic Floor Contraction  - 1 x daily - 7 x weekly - 1 sets - 10 reps - Seated Cough with Pelvic Floor Contraction and Hand to Mouth  - 1 x daily - 7 x weekly - 1 sets - 10 reps - Seated Pelvic Floor Muscles Isometrics  on Towel Roll  - 1 x daily - 7 x weekly - 1 sets - 10 reps - 10s holds - Seated Quick Flick Pelvic Floor Contractions  - 1 x daily - 7 x weekly - 2 sets - 10 reps  ASSESSMENT:  CLINICAL IMPRESSION: Patient is a 48 y.o. female  who was seen today for physical therapy evaluation and treatment for stress urinary incontinence. Pt tolerated session well, denied leakage during sessions does benefit from cues for pelvic floor and/or core activation/breathing mechanics for all exercises. Pt does continue to have Rt knee pain which does limit more dynamic activity and higher resistance in standing. Pt would benefit from additional PT to further address deficits.    OBJECTIVE IMPAIRMENTS: decreased activity tolerance, decreased coordination, decreased endurance, decreased mobility, decreased strength, increased fascial restrictions, increased muscle spasms, impaired flexibility, improper body mechanics, postural dysfunction, and pain.   ACTIVITY LIMITATIONS: standing, squatting, and continence  PARTICIPATION LIMITATIONS: community activity  PERSONAL FACTORS: Time since onset of injury/illness/exacerbation are also affecting patient's functional outcome.   REHAB POTENTIAL: Good  CLINICAL DECISION MAKING: Stable/uncomplicated  EVALUATION COMPLEXITY: Low   GOALS: Goals reviewed with patient? Yes  SHORT TERM GOALS: Target date: 11/07/23  Pt to be I with HEP.  Baseline: Goal status: on going  2.  Pt to be I with knack and pressure management to decreased strain at pelvic floor and urinary incontinence.  Baseline:  Goal status: MET  3.  Pt to report improvement of urinary incontinence with no more than  1 instance of leakage in a day for improved QOL and confidence of community outings. Baseline:  Goal status: MET    LONG TERM GOALS: Target date: 04/08/24  Pt to be I with advanced HEP.  Baseline:  Goal status: on going  2.  Pt to demonstrate at least 4/5 pelvic floor strength for improved pelvic stability and decreased strain at pelvic floor/ decrease leakage.  Baseline:  Goal status: on going  3.  Pt to report no more than 1 instance of urinary incontinence with stressors in a week for improved QOL.  Baseline:  Goal status: on going  4.  Pt to demonstrate at least 5/5 bil hip strength for improved pelvic stability and functional squats without leakage.  Baseline:  Goal status: on going  5.  Pt to demonstrate ability to complete x10 10# squats without compensatory strategies without leakage or increased back pain.   Baseline:  Goal status: on going PLAN:  PT FREQUENCY: 1-2x/week  PT DURATION:  8 sessions  PLANNED INTERVENTIONS: 97110-Therapeutic exercises, 97530- Therapeutic activity, 97112- Neuromuscular re-education, 97535- Self Care, 16109- Manual therapy, Patient/Family education, Taping, Dry Needling, Joint mobilization, Spinal mobilization, Scar mobilization, DME instructions, Cryotherapy, Moist heat, and Biofeedback  PLAN FOR NEXT SESSION: core and hip strengthening, coordination of pelvic floor and exercises, stretching backs and hips  Otelia Sergeant, PT, DPT 03/26/254:56 PM

## 2023-12-08 ENCOUNTER — Ambulatory Visit: Payer: Federal, State, Local not specified - PPO | Attending: Physician Assistant | Admitting: Physical Therapy

## 2023-12-08 DIAGNOSIS — M6281 Muscle weakness (generalized): Secondary | ICD-10-CM | POA: Insufficient documentation

## 2023-12-08 DIAGNOSIS — R293 Abnormal posture: Secondary | ICD-10-CM | POA: Diagnosis present

## 2023-12-08 DIAGNOSIS — R279 Unspecified lack of coordination: Secondary | ICD-10-CM | POA: Insufficient documentation

## 2023-12-08 NOTE — Therapy (Signed)
 OUTPATIENT PHYSICAL THERAPY FEMALE PELVIC EVALUATION   Patient Name: Michaela Thomas MRN: [id number] DOB:02-May-1976, 48 y.o., female Today's Date: 12/08/2023  END OF SESSION:  PT End of Session - 12/08/23 0757     Visit Number 4    Date for PT Re-Evaluation 04/08/24    Authorization Type [employer]    PT Start Time 0720    PT Stop Time 0800    PT Time Calculation (min) 40 min    Activity Tolerance Patient tolerated treatment well    Behavior During Therapy [facility] for tasks assessed/performed              Past Medical History:  Diagnosis Date   Anxiety 2013   resolved per pt   Asthma    Follows w/ Theora Gianotti, PA.   Back pain    Muscle spasms   Chronic constipation    Follows w/ GI.   Constipation    w/ right lower quadrant pain   DM (diabetes mellitus), type 2 (HCC)    Follows w/ PCP.   Hyperlipidemia    Follows w/ PCP, Theora Gianotti, PA.   Hypertension    Right lower quadrant pain 2024   severe, per pt, unsure if GI related   Wears contact lenses    Wears glasses    Past Surgical History:  Procedure Laterality Date   COLONOSCOPY  06/18/2022   two polyps   COLPOSCOPY  08/24/1999   CYSTOSCOPY N/A 05/05/2023   Procedure: CYSTOSCOPY;  Surgeon: Edwinna Areola, DO;  Location: [facility];  Service: Gynecology;  Laterality: N/A;   ESSURE TUBAL LIGATION  2017   HYSTEROSCOPY WITH D & C Left 12/25/2021   Procedure: DILATATION AND CURETTAGE /HYSTEROSCOPY/DIAGNOSTIC HYSTEROSCOPY UNDER ULTRASOUND GUIDANCE WITH ESSURE REMOVAL;  Surgeon: Edwinna Areola, DO;  Location: [facility];  Service: Gynecology;  Laterality: Left;  removal of essure in uterus   HYSTEROSCOPY WITH NOVASURE  2018   LAPAROSCOPIC LYSIS OF ADHESIONS  05/05/2023   Procedure: LAPAROSCOPIC LYSIS OF ADHESIONS;  Surgeon: Edwinna Areola, DO;  Location: [facility];  Service: Gynecology;;   OPERATIVE ULTRASOUND N/A 12/25/2021   Procedure: OPERATIVE  ULTRASOUND;  Surgeon: Edwinna Areola, DO;  Location: [facility];  Service: Gynecology;  Laterality: N/A;   TOTAL LAPAROSCOPIC HYSTERECTOMY WITH SALPINGECTOMY Bilateral 05/05/2023   Procedure: TOTAL LAPAROSCOPIC HYSTERECTOMY WITH SALPINGECTOMY;  Surgeon: Edwinna Areola, DO;  Location: [facility];  Service: Gynecology;  Laterality: Bilateral;   Patient Active Problem List   Diagnosis Date Noted   Chronic pelvic pain in female 05/05/2023   Post-operative state 05/05/2023   RLQ abdominal pain 01/14/2023   Chronic constipation 01/13/2023   Herpes genitalis 12/22/2022   IBS (irritable bowel syndrome) 12/22/2022   IUD (intrauterine device) in place 12/22/2022   Controlled type 2 diabetes mellitus without complication, without long-term current use of insulin (HCC) 10/11/2022   Essential hypertension 10/11/2022   Herpes simplex 10/08/2013   Smoker 07/17/2012   Family history of breast cancer 07/17/2012   History of abnormal Pap smear 07/17/2012    PCP: Porfirio Oar, PA   REFERRING PROVIDER: Porfirio Oar, PA   REFERRING DIAG: N39.3 (ICD-10-CM) - Stress incontinence (female) (female)  THERAPY DIAG:  Muscle weakness (generalized)  Unspecified lack of coordination  Abnormal posture  Rationale for Evaluation and Treatment: Rehabilitation  ONSET DATE: 2022  SUBJECTIVE:  SUBJECTIVE STATEMENT: Denies all urinary incontinence right for a couple weeks. Also has been working on knee pain and these have been getting better.   Fluid intake: water - 32oz most days; coffee sometimes, sugar free sodas 1-2x daily  PAIN:  Are you having pain? Yes NPRS scale: 2/10 Pain location:  Rt knee   Pain type: aching  Pain description: intermittent   Aggravating factors:  varies Relieving factors: sitting, meds sometimes, heat some  PRECAUTIONS: None  RED FLAGS: None   WEIGHT BEARING RESTRICTIONS: No  FALLS:  Has patient fallen in last 6 months? No  OCCUPATION: Child psychotherapist  ACTIVITY LEVEL : working out 3-4x weekly  PLOF: Independent  PATIENT GOALS: to stop peeing myself  PERTINENT HISTORY:  Diabetes mellitus without complication, hypertension, and hyperlipidemia.  She has had a tubal ligation. Recent CT scan showed no GI abnormalities pelvic floor dyssnergia, Herpes genitalis, TOTAL LAPAROSCOPIC HYSTERECTOMY WITH SALPINGECTOMY  Sexual abuse: No  BOWEL MOVEMENT: Pain with bowel movement: No Type of bowel movement:Type (Bristol Stool Scale) 4, Frequency daily, and Strain no Fully empty rectum: Yes:   Leakage: No Pads: No Fiber supplement/laxative No  URINATION: Pain with urination: No Fully empty bladder: Yes:   Stream: Strong Urgency: No Frequency: with blood pressure medicine first thing in the morning may go a little quicker but not consistently; not nightly  Leakage: Coughing Pads: Yes: with sickness  INTERCOURSE:  Ability to have vaginal penetration Yes  Pain with intercourse: none DrynessNo Climax: not painful Marinoff Scale: 0/3   PREGNANCY: Vaginal deliveries 2 Tearing Yes:   Episiotomy No C-section deliveries 0 Currently pregnant No  PROLAPSE: None   OBJECTIVE:  Note: Objective measures were completed at Evaluation unless otherwise noted.  DIAGNOSTIC FINDINGS:    COGNITION: Overall cognitive status: Within functional limits for tasks assessed     SENSATION: Light touch: Appears intact  LUMBAR SPECIAL TESTS:  SI Compression/distraction test: Negative, and FABER test: Negative  FUNCTIONAL TESTS:  Functional squat - slight bil valgus with return to stand and decreased limited  by 25%  GAIT: WFL  POSTURE: rounded shoulders and anterior pelvic tilt   LUMBARAROM/PROM:  A/PROM A/PROM  eval   Flexion WFL  Extension WFL  Right lateral flexion Limited by 25%  Left lateral flexion Limited by 25%  Right rotation Limited by 25%  Left rotation Limited by 25%   (Blank rows = not tested)  LOWER EXTREMITY ROM:  WFL  LOWER EXTREMITY MMT: Bil hips grossly 4+/5; knees 5/5  PALPATION:   General: tight lumbar paraspinals   Pelvic Alignment: WFL  Abdominal: mild fascial restrictions in lower abdominal quadrants but no TTP                External Perineal Exam: mild dryness noted but no pain                             Internal Pelvic Floor: no TTP, WFL  Patient confirms identification and approves PT to assess internal pelvic floor and treatment Yes No emotional/communication barriers or cognitive limitation. Patient is motivated to learn. Patient understands and agrees with treatment goals and plan. PT explains patient will be examined in standing, sitting, and lying down to see how their muscles and joints work. When they are ready, they will be asked to remove their underwear so PT can examine their perineum. The patient is also given the option of providing their own chaperone as one is  not provided in our facility. The patient also has the right and is explained the right to defer or refuse any part of the evaluation or treatment including the internal exam. With the patient's consent, PT will use one gloved finger to gently assess the muscles of the pelvic floor, seeing how well it contracts and relaxes and if there is muscle symmetry. After, the patient will get dressed and PT and patient will discuss exam findings and plan of care. PT and patient discuss plan of care, schedule, attendance policy and HEP activities.   PELVIC MMT:   MMT eval  Vaginal 3/5, 10s, 4 reps  Internal Anal Sphincter   External Anal Sphincter   Puborectalis   Diastasis Recti   (Blank rows = not tested)        TONE: WFL  PROLAPSE: Mild anterior wall mobility felt with cough but not visualized  during assessment in hooklying   TODAY'S TREATMENT:                                                                                                                              DATE:    11/09/23: NMRE: all exercises for exhale and pelvic floor contraction with activity for improved coordination of muscle activation and decreased stress at pelvic floor for decreased leakage.  Opp hand/knee ball press 2x10 Bridges 2x10 Sidelying hip abduction red loop 2x10 Sidelying reverse clam red loop 2x10 Bird dogs x10 - moderate cues for transverse abdominis activation 2x10 Sit to stand from mat Alt mario punches x10 each Farmers carry 10# 200' x2 each hand   11/16/23: NMRE: all exercises for exhale and pelvic floor contraction with activity for improved coordination of muscle activation and decreased stress at pelvic floor for decreased leakage.   2x10 hooklying opp hand/knee ball press X5 modified single leg Sit to stand  2x10 seated bil shoulder horizontal abduction small power cord Mario punches 8# 2x10 Wall mountain climbers 2x10  Bosu quick step ups x10 each Standing pelvic floor contractions with mini coughs  12/23/2023 Dead bugs (LE in 90/90) 2# hand weights 2x10 - moderate tactile cues for transverse abdominis activation 2x10 bridges red loop with hip abduction  Sidelying hip clam red loop with ball press 2x10 Blue band bil shoulder horizontal abduction standing 2x10 Standing marching with 8# iso chest hold with elbows extended x10 - rt knee pain and halted Red loop donkey kicks 2x10 each Red loop fire hydrants 2x10  Supine pelvic floor contractions 2x10, 2x10 quick flicks, x10 10s isometrics  PATIENT EDUCATION:  Education details: [email or web address] Person educated: Patient Education method: Programmer, multimedia, Demonstration, Actor cues, Verbal cues, and Handouts Education comprehension: verbalized understanding, returned demonstration, verbal cues required, tactile cues required, and needs further  education  HOME EXERCISE PROGRAM: Access Code: [email or web address] URL: [email or web address] Date: 10/10/2023 Prepared by: Harvin Konicek  ASSESSMENT:  CLINICAL IMPRESSION: Patient is a 48 y.o. female  who was seen today for physical therapy evaluation and treatment for stress  urinary incontinence. Pt tolerated session well, denied leakage during sessions does benefit from cues for pelvic floor and/or core activation/breathing mechanics for all exercises. Pt does continue to have Rt knee pain which does limit more dynamic activity and higher resistance in standing but able to progress today with higher resistance bands in sitting or on mat exercises just not standing. Pt would benefit from additional PT to further address deficits.    OBJECTIVE IMPAIRMENTS: decreased activity tolerance, decreased coordination, decreased endurance, decreased mobility, decreased strength, increased fascial restrictions, increased muscle spasms, impaired flexibility, improper body mechanics, postural dysfunction, and pain.   ACTIVITY LIMITATIONS: standing, squatting, and continence  PARTICIPATION LIMITATIONS: community activity  PERSONAL FACTORS: Time since onset of injury/illness/exacerbation are also affecting patient's functional outcome.   REHAB POTENTIAL: Good  CLINICAL DECISION MAKING: Stable/uncomplicated  EVALUATION COMPLEXITY: Low   GOALS: Goals reviewed with patient? Yes  SHORT TERM GOALS: Target date: 11/07/23  Pt to be I with HEP.  Baseline: Goal status:MET  2.  Pt to be I with knack and pressure management to decreased strain at pelvic floor and urinary incontinence.  Baseline:  Goal status: MET  3.  Pt to report improvement of urinary incontinence with no more than 1 instance of leakage in a day for improved QOL and confidence of community outings. Baseline:  Goal status: MET    LONG TERM GOALS: Target date: 04/08/24  Pt to be I with advanced HEP.  Baseline:  Goal status: on  going  2.  Pt to demonstrate at least 4/5 pelvic floor strength for improved pelvic stability and decreased strain at pelvic floor/ decrease leakage.  Baseline:  Goal status: on going  3.  Pt to report no more than 1 instance of urinary incontinence with stressors in a week for improved QOL.  Baseline:  Goal status: on going  4.  Pt to demonstrate at least 5/5 bil hip strength for improved pelvic stability and functional squats without leakage.  Baseline:  Goal status: on going  5.  Pt to demonstrate ability to complete x10 10# squats without compensatory strategies without leakage or increased back pain.   Baseline:  Goal status: on going PLAN:  PT FREQUENCY: 1-2x/week  PT DURATION:  8 sessions  PLANNED INTERVENTIONS: 97110-Therapeutic exercises, 97530- Therapeutic activity, 97112- Neuromuscular re-education, 97535- Self Care, [id number]- Manual therapy, Patient/Family education, Taping, Dry Needling, Joint mobilization, Spinal mobilization, Scar mobilization, DME instructions, Cryotherapy, Moist heat, and Biofeedback  PLAN FOR NEXT SESSION: core and hip strengthening, coordination of pelvic floor and exercises, stretching backs and hips  Avie Lemme, PT, DPT 04/17/258:01 AM

## 2023-12-20 ENCOUNTER — Ambulatory Visit: Payer: Federal, State, Local not specified - PPO | Admitting: Physical Therapy

## 2023-12-20 DIAGNOSIS — M6281 Muscle weakness (generalized): Secondary | ICD-10-CM

## 2023-12-20 DIAGNOSIS — R293 Abnormal posture: Secondary | ICD-10-CM

## 2023-12-20 DIAGNOSIS — R279 Unspecified lack of coordination: Secondary | ICD-10-CM

## 2023-12-20 NOTE — Therapy (Signed)
 OUTPATIENT PHYSICAL THERAPY FEMALE PELVIC EVALUATION   Patient Name: Michaela Thomas MRN: [id number] DOB:1975/10/06, 48 y.o., female Today's Date: 12/20/2023  END OF SESSION:  PT End of Session - 12/20/23 0805     Visit Number 5    Date for PT Re-Evaluation 04/08/24    Authorization Type [employer]    PT Start Time 0804    PT Stop Time 0842    PT Time Calculation (min) 38 min    Activity Tolerance Patient tolerated treatment well    Behavior During Therapy [facility] for tasks assessed/performed              Past Medical History:  Diagnosis Date   Anxiety 2013   resolved per pt   Asthma    Follows w/ Aleda Hurl, PA.   Back pain    Muscle spasms   Chronic constipation    Follows w/ GI.   Constipation    w/ right lower quadrant pain   DM (diabetes mellitus), type 2 (HCC)    Follows w/ PCP.   Hyperlipidemia    Follows w/ PCP, Aleda Hurl, PA.   Hypertension    Right lower quadrant pain 2024   severe, per pt, unsure if GI related   Wears contact lenses    Wears glasses    Past Surgical History:  Procedure Laterality Date   COLONOSCOPY  06/18/2022   two polyps   COLPOSCOPY  08/24/1999   CYSTOSCOPY N/A 05/05/2023   Procedure: CYSTOSCOPY;  Surgeon: Loa Riling, DO;  Location: [facility];  Service: Gynecology;  Laterality: N/A;   ESSURE TUBAL LIGATION  2017   HYSTEROSCOPY WITH D & C Left 12/25/2021   Procedure: DILATATION AND CURETTAGE /HYSTEROSCOPY/DIAGNOSTIC HYSTEROSCOPY UNDER ULTRASOUND GUIDANCE WITH ESSURE REMOVAL;  Surgeon: Loa Riling, DO;  Location: [facility];  Service: Gynecology;  Laterality: Left;  removal of essure in uterus   HYSTEROSCOPY WITH NOVASURE  2018   LAPAROSCOPIC LYSIS OF ADHESIONS  05/05/2023   Procedure: LAPAROSCOPIC LYSIS OF ADHESIONS;  Surgeon: Loa Riling, DO;  Location: [facility];  Service: Gynecology;;   OPERATIVE ULTRASOUND N/A 12/25/2021   Procedure: OPERATIVE  ULTRASOUND;  Surgeon: Loa Riling, DO;  Location: [facility];  Service: Gynecology;  Laterality: N/A;   TOTAL LAPAROSCOPIC HYSTERECTOMY WITH SALPINGECTOMY Bilateral 05/05/2023   Procedure: TOTAL LAPAROSCOPIC HYSTERECTOMY WITH SALPINGECTOMY;  Surgeon: Loa Riling, DO;  Location: [facility];  Service: Gynecology;  Laterality: Bilateral;   Patient Active Problem List   Diagnosis Date Noted   Chronic pelvic pain in female 05/05/2023   Post-operative state 05/05/2023   RLQ abdominal pain 01/14/2023   Chronic constipation 01/13/2023   Herpes genitalis 12/22/2022   IBS (irritable bowel syndrome) 12/22/2022   IUD (intrauterine device) in place 12/22/2022   Controlled type 2 diabetes mellitus without complication, without long-term current use of insulin (HCC) 10/11/2022   Essential hypertension 10/11/2022   Herpes simplex 10/08/2013   Smoker 07/17/2012   Family history of breast cancer 07/17/2012   History of abnormal Pap smear 07/17/2012    PCP: Aldine Humphreys, PA   REFERRING PROVIDER: Aldine Humphreys, PA   REFERRING DIAG: N39.3 (ICD-10-CM) - Stress incontinence (female) (female)  THERAPY DIAG:  Muscle weakness (generalized)  Unspecified lack of coordination  Abnormal posture  Rationale for Evaluation and Treatment: Rehabilitation  ONSET DATE: 2022  SUBJECTIVE:  SUBJECTIVE STATEMENT: Denies all urinary incontinence right for a couple weeks. Also has been working on knee pain and these have been getting better.   Fluid intake: water - 32oz most days; coffee sometimes, sugar free sodas 1-2x daily  PAIN:  Are you having pain? Yes NPRS scale: 2/10 Pain location:  Rt knee   Pain type: aching  Pain description: intermittent   Aggravating factors:  varies Relieving factors: sitting, meds sometimes, heat some  PRECAUTIONS: None  RED FLAGS: None   WEIGHT BEARING RESTRICTIONS: No  FALLS:  Has patient fallen in last 6 months? No  OCCUPATION: Child psychotherapist  ACTIVITY LEVEL : working out 3-4x weekly  PLOF: Independent  PATIENT GOALS: to stop peeing myself  PERTINENT HISTORY:  Diabetes mellitus without complication, hypertension, and hyperlipidemia.  She has had a tubal ligation. Recent CT scan showed no GI abnormalities pelvic floor dyssnergia, Herpes genitalis, TOTAL LAPAROSCOPIC HYSTERECTOMY WITH SALPINGECTOMY  Sexual abuse: No  BOWEL MOVEMENT: Pain with bowel movement: No Type of bowel movement:Type (Bristol Stool Scale) 4, Frequency daily, and Strain no Fully empty rectum: Yes:   Leakage: No Pads: No Fiber supplement/laxative No  URINATION: Pain with urination: No Fully empty bladder: Yes:   Stream: Strong Urgency: No Frequency: with blood pressure medicine first thing in the morning may go a little quicker but not consistently; not nightly  Leakage: Coughing Pads: Yes: with sickness  INTERCOURSE:  Ability to have vaginal penetration Yes  Pain with intercourse: none DrynessNo Climax: not painful Marinoff Scale: 0/3   PREGNANCY: Vaginal deliveries 2 Tearing Yes:   Episiotomy No C-section deliveries 0 Currently pregnant No  PROLAPSE: None   OBJECTIVE:  Note: Objective measures were completed at Evaluation unless otherwise noted.  DIAGNOSTIC FINDINGS:    COGNITION: Overall cognitive status: Within functional limits for tasks assessed     SENSATION: Light touch: Appears intact  LUMBAR SPECIAL TESTS:  SI Compression/distraction test: Negative, and FABER test: Negative  FUNCTIONAL TESTS:  Functional squat - slight bil valgus with return to stand and decreased limited  by 25%  GAIT: WFL  POSTURE: rounded shoulders and anterior pelvic tilt   LUMBARAROM/PROM:  A/PROM A/PROM  eval   Flexion WFL  Extension WFL  Right lateral flexion Limited by 25%  Left lateral flexion Limited by 25%  Right rotation Limited by 25%  Left rotation Limited by 25%   (Blank rows = not tested)  LOWER EXTREMITY ROM:  WFL  LOWER EXTREMITY MMT: Bil hips grossly 4+/5; knees 5/5  PALPATION:   General: tight lumbar paraspinals   Pelvic Alignment: WFL  Abdominal: mild fascial restrictions in lower abdominal quadrants but no TTP                External Perineal Exam: mild dryness noted but no pain                             Internal Pelvic Floor: no TTP, WFL  Patient confirms identification and approves PT to assess internal pelvic floor and treatment Yes No emotional/communication barriers or cognitive limitation. Patient is motivated to learn. Patient understands and agrees with treatment goals and plan. PT explains patient will be examined in standing, sitting, and lying down to see how their muscles and joints work. When they are ready, they will be asked to remove their underwear so PT can examine their perineum. The patient is also given the option of providing their own chaperone as one is  not provided in our facility. The patient also has the right and is explained the right to defer or refuse any part of the evaluation or treatment including the internal exam. With the patient's consent, PT will use one gloved finger to gently assess the muscles of the pelvic floor, seeing how well it contracts and relaxes and if there is muscle symmetry. After, the patient will get dressed and PT and patient will discuss exam findings and plan of care. PT and patient discuss plan of care, schedule, attendance policy and HEP activities.   PELVIC MMT:   MMT eval  Vaginal 3/5, 10s, 4 reps  Internal Anal Sphincter   External Anal Sphincter   Puborectalis   Diastasis Recti   (Blank rows = not tested)        TONE: WFL  PROLAPSE: Mild anterior wall mobility felt with cough but not visualized  during assessment in hooklying   TODAY'S TREATMENT:                                                                                                                              DATE:    11/09/23: NMRE: all exercises for exhale and pelvic floor contraction with activity for improved coordination of muscle activation and decreased stress at pelvic floor for decreased leakage.  Opp hand/knee ball press 2x10 Bridges 2x10 Sidelying hip abduction red loop 2x10 Sidelying reverse clam red loop 2x10 Bird dogs x10 - moderate cues for transverse abdominis activation 2x10 Sit to stand from mat Alt mario punches x10 each Farmers carry 10# 200' x2 each hand   11/16/23: NMRE: all exercises for exhale and pelvic floor contraction with activity for improved coordination of muscle activation and decreased stress at pelvic floor for decreased leakage.   2x10 hooklying opp hand/knee ball press X5 modified single leg Sit to stand  2x10 seated bil shoulder horizontal abduction small power cord Mario punches 8# 2x10 Wall mountain climbers 2x10  Bosu quick step ups x10 each Standing pelvic floor contractions with mini coughs  12/11/2023 Dead bugs (LE in 90/90) 2# hand weights 2x10 - moderate tactile cues for transverse abdominis activation 2x10 bridges red loop with hip abduction  Sidelying hip clam red loop with ball press 2x10 Blue band bil shoulder horizontal abduction standing 2x10 Standing marching with 8# iso chest hold with elbows extended x10 - rt knee pain and halted Red loop donkey kicks 2x10 each Red loop fire hydrants 2x10  Supine pelvic floor contractions 2x10, 2x10 quick flicks, x10 10s isometrics  12/20/23: Hamstring stretches 3x30s each Standing bil shoulder horizontal abduction small power cord 2x10 Lateral Trunk bends 10# 2x10 Cables 5# palloffs 2x10 each 10# held chest alt marching 2x10, held iso at OHP 2x10 2x10 forward/back bounding, lateral side hops x10 Airex pad heel raises  2x10 Sidelying reverse clams red band 2x10 each Farmers' carry 10#+4# 1000' Seated 4# anti rotation bil 2x10  PATIENT EDUCATION:  Education details: [email or web address] Person educated:  Patient Education method: Explanation, Demonstration, Tactile cues, Verbal cues, and Handouts Education comprehension: verbalized understanding, returned demonstration, verbal cues required, tactile cues required, and needs further education  HOME EXERCISE PROGRAM: Access Code: [email or web address] URL: [email or web address] Date: 10/10/2023 Prepared by: Charissa Knowles  ASSESSMENT:  CLINICAL IMPRESSION: Pt presents for treatment, states she has had great progress and no longer having urinary incontinence. Pt is now able to complete a full dance class and program without leakage, no longer having leakage with coughing and very pleased with progress. Pt has met all goals and tolerating activity without leakage or increased pain. Pt is having knee pain but receiving PT for this elsewhere. This will serve as DC, pt understands she will need new referral for PT if needed in the future.   OBJECTIVE IMPAIRMENTS: decreased activity tolerance, decreased coordination, decreased endurance, decreased mobility, decreased strength, increased fascial restrictions, increased muscle spasms, impaired flexibility, improper body mechanics, postural dysfunction, and pain.   ACTIVITY LIMITATIONS: standing, squatting, and continence  PARTICIPATION LIMITATIONS: community activity  PERSONAL FACTORS: Time since onset of injury/illness/exacerbation are also affecting patient's functional outcome.   REHAB POTENTIAL: Good  CLINICAL DECISION MAKING: Stable/uncomplicated  EVALUATION COMPLEXITY: Low   GOALS: Goals reviewed with patient? Yes  SHORT TERM GOALS: Target date: 11/07/23  Pt to be I with HEP.  Baseline: Goal status:MET  2.  Pt to be I with knack and pressure management to decreased strain at pelvic floor and urinary  incontinence.  Baseline:  Goal status: MET  3.  Pt to report improvement of urinary incontinence with no more than 1 instance of leakage in a day for improved QOL and confidence of community outings. Baseline:  Goal status: MET    LONG TERM GOALS: Target date: 04/08/24  Pt to be I with advanced HEP.  Baseline:  Goal status: MET  2.  Pt to demonstrate at least 4/5 pelvic floor strength for improved pelvic stability and decreased strain at pelvic floor/ decrease leakage.  Baseline:  Goal status: pt deferred due to lack of symptoms  3.  Pt to report no more than 1 instance of urinary incontinence with stressors in a week for improved QOL.  Baseline:  Goal status: MET  4.  Pt to demonstrate at least 5/5 bil hip strength for improved pelvic stability and functional squats without leakage.  Baseline:  Goal status: MET  5.  Pt to demonstrate ability to complete x10 10# squats without compensatory strategies without leakage or increased back pain.   Baseline:  Goal status: MET PLAN:  PT FREQUENCY: 1-2x/week  PT DURATION:  8 sessions  PLANNED INTERVENTIONS: 97110-Therapeutic exercises, 97530- Therapeutic activity, 97112- Neuromuscular re-education, 97535- Self Care, [id number]- Manual therapy, Patient/Family education, Taping, Dry Needling, Joint mobilization, Spinal mobilization, Scar mobilization, DME instructions, Cryotherapy, Moist heat, and Biofeedback  PLAN FOR NEXT SESSION:   PHYSICAL THERAPY DISCHARGE SUMMARY  Visits from Start of Care: 5  Current functional level related to goals / functional outcomes: All goals met, except internal strength not retested as pt deferred with no longer having symptoms.    Remaining deficits: None    Education / Equipment: HEP   Patient agrees to discharge. Patient goals were met. Patient is being discharged due to being pleased with the current functional level.  Avie Lemme, PT, DPT 04/29/258:06 AM

## 2023-12-22 ENCOUNTER — Ambulatory Visit: Payer: Federal, State, Local not specified - PPO | Admitting: Physical Therapy

## 2023-12-27 ENCOUNTER — Encounter: Payer: Federal, State, Local not specified - PPO | Admitting: Physical Therapy

## 2023-12-29 ENCOUNTER — Encounter: Payer: Federal, State, Local not specified - PPO | Admitting: Physical Therapy

## 2024-01-11 ENCOUNTER — Emergency Department (HOSPITAL_COMMUNITY)

## 2024-01-11 ENCOUNTER — Encounter (HOSPITAL_COMMUNITY): Payer: Self-pay

## 2024-01-11 ENCOUNTER — Emergency Department (HOSPITAL_COMMUNITY)
Admission: EM | Admit: 2024-01-11 | Discharge: 2024-01-12 | Disposition: A | Discharge status: Home / Self Care | Attending: Emergency Medicine | Admitting: Emergency Medicine

## 2024-01-11 ENCOUNTER — Other Ambulatory Visit: Payer: Self-pay

## 2024-01-11 DIAGNOSIS — F17200 Nicotine dependence, unspecified, uncomplicated: Secondary | ICD-10-CM | POA: Insufficient documentation

## 2024-01-11 DIAGNOSIS — J209 Acute bronchitis, unspecified: Secondary | ICD-10-CM | POA: Insufficient documentation

## 2024-01-11 DIAGNOSIS — R059 Cough, unspecified: Secondary | ICD-10-CM | POA: Diagnosis present

## 2024-01-11 LAB — BASIC METABOLIC PANEL WITH GFR
Anion gap: 9 (ref 5–15)
BUN: 20 mg/dL (ref 6–20)
CO2: 23 mmol/L (ref 22–32)
Calcium: 9.7 mg/dL (ref 8.9–10.3)
Chloride: 102 mmol/L (ref 98–111)
Creatinine, Ser: 0.78 mg/dL (ref 0.44–1.00)
GFR, Estimated: >60 mL/min (ref >60)
Glucose, Bld: 136 mg/dL — ABNORMAL HIGH (ref 70–99)
Potassium: 3.1 mmol/L — ABNORMAL LOW (ref 3.5–5.1)
Sodium: 134 mmol/L — ABNORMAL LOW (ref 135–145)

## 2024-01-11 LAB — CBC
HCT: 34.7 % — ABNORMAL LOW (ref 36.0–46.0)
Hemoglobin: 11.2 g/dL — ABNORMAL LOW (ref 12.0–15.0)
MCH: 27.1 pg (ref 26.0–34.0)
MCHC: 32.3 g/dL (ref 30.0–36.0)
MCV: 84 fL (ref 80.0–100.0)
Platelets: 419 10*3/uL — ABNORMAL HIGH (ref 150–400)
RBC: 4.13 MIL/uL (ref 3.87–5.11)
RDW: 14.5 % (ref 11.5–15.5)
WBC: 18.2 10*3/uL — ABNORMAL HIGH (ref 4.0–10.5)
nRBC: 0 % (ref 0.0–0.2)

## 2024-01-11 LAB — RESP PANEL BY RT-PCR (RSV, FLU A&B, COVID)  RVPGX2
Influenza A by PCR: NEGATIVE
Influenza B by PCR: NEGATIVE
Resp Syncytial Virus by PCR: NEGATIVE
SARS Coronavirus 2 by RT PCR: NEGATIVE

## 2024-01-11 LAB — TROPONIN I (HIGH SENSITIVITY): Troponin I (High Sensitivity): 8 ng/L (ref <18)

## 2024-01-11 NOTE — ED Triage Notes (Signed)
 Pt reports cough over the last couple days. Pt was given a z pak and steroid on Monday and has had some relief. Pt reports continued cough and chest tightness tonight.

## 2024-01-12 LAB — TROPONIN I (HIGH SENSITIVITY): Troponin I (High Sensitivity): 7 ng/L (ref <18)

## 2024-01-12 MED ORDER — ONDANSETRON 4 MG PO TBDP
4.0000 mg | ORAL_TABLET | Freq: Once | ORAL | Status: AC
  Administered 2024-01-12: 4 mg via ORAL
  Filled 2024-01-12: qty 1

## 2024-01-12 MED ORDER — ALBUTEROL SULFATE HFA 108 (90 BASE) MCG/ACT IN AERS
4.0000 | INHALATION_SPRAY | Freq: Once | RESPIRATORY_TRACT | Status: AC
  Administered 2024-01-12: 4 via RESPIRATORY_TRACT
  Filled 2024-01-12: qty 6.7

## 2024-01-12 MED ORDER — PROMETHAZINE-DM 6.25-15 MG/5ML PO SYRP: 5.0000 mL | ORAL_SOLUTION | Freq: Four times a day (QID) | ORAL | 0 refills | Status: AC | PRN

## 2024-01-12 MED ORDER — AEROCHAMBER Z-STAT PLUS/MEDIUM MISC
1.0000 | Freq: Once | Status: AC
  Administered 2024-01-12: 1
  Filled 2024-01-12: qty 1

## 2024-01-12 MED ORDER — HYDROCODONE-ACETAMINOPHEN 7.5-325 MG/15ML PO SOLN
10.0000 mL | Freq: Once | ORAL | Status: AC
  Administered 2024-01-12: 10 mL via ORAL
  Filled 2024-01-12: qty 15

## 2024-01-12 NOTE — ED Provider Notes (Signed)
 [place] EMERGENCY DEPARTMENT AT [facility] Provider Note   CSN: [id number] Arrival date & time: 01/11/24  2216     History  Chief Complaint  Patient presents with   Cough   Chest Pain    Michaela Thomas is a 48 y.o. female.  HPI     This is a 48 year old female who presents with upper respiratory symptoms.  Patient reports that she has had cough and some shortness of breath with chest discomfort over the last several days.  She had a telehealth visit on Monday and was prescribed a Z-Pak and steroids.  No history of asthma but does smoke daily.  Tonight she states that she had a fitful episode of coughing followed by vomiting and some chest tightness.  No exertional symptoms.  Patient states that she feels like she is getting choked she is coughing so much.  She has tried Tessalon  Perles.  Reports using an inhaler but does not have a spacer.  Has not had any fevers.  No known sick contacts.  Home Medications Prior to Admission medications   Medication Sig Start Date End Date Taking? Authorizing Provider  albuterol  (PROVENTIL  HFA;VENTOLIN  HFA) 108 (90 Base) MCG/ACT inhaler Inhale 2 puffs into the lungs every 6 (six) hours as needed for wheezing or shortness of breath. 12/14/17   McManama, Katherine D, FNP  amLODipine  (NORVASC ) 10 MG tablet Take 10 mg by mouth daily.    [provider]  amoxicillin -clavulanate (AUGMENTIN ) 875-125 MG tablet Take 1 tablet by mouth every 12 (twelve) hours. 05/21/23   Eldon Greenland, MD  cyclobenzaprine  (FLEXERIL ) 10 MG tablet Take 1 tablet (10 mg total) by mouth 2 (two) times daily as needed for muscle spasms. Patient not taking: Reported on 05/21/2023 04/27/22   Debbra Fairy, PA-C  dicyclomine  (BENTYL ) 20 MG tablet TAKE 1 TABLET (20 MG TOTAL) BY MOUTH 4 (FOUR) TIMES DAILY - BEFORE MEALS AND AT BEDTIME. Patient not taking: Reported on 05/21/2023 03/04/23   Zehr, Jessica D, PA-C  fluconazole  (DIFLUCAN ) 150 MG tablet Take 1 tablet (150 mg  total) by mouth daily. 05/21/23   Eldon Greenland, MD  glipiZIDE  (GLUCOTROL ) 5 MG tablet TAKE 0.5 TABLET (2.5 MG TOTAL) BY MOUTH DAILY BEFORE BREAKFAST. 05/13/23   Viola Greulich, MD  hydrochlorothiazide  (HYDRODIURIL ) 25 MG tablet TAKE 1 TABLET BY MOUTH EVERY DAY 09/01/22   Viola Greulich, MD  ibuprofen  (ADVIL ) 600 MG tablet Take 1 tablet (600 mg total) by mouth every 6 (six) hours as needed for moderate pain or cramping. 05/05/23   Loa Riling, DO  linaclotide  (LINZESS ) 145 MCG CAPS capsule Take 1 capsule (145 mcg total) by mouth daily before breakfast. 01/13/23   Zehr, Jessica D, PA-C  metFORMIN (GLUCOPHAGE-XR) 500 MG 24 hr tablet Take 500 mg by mouth daily with breakfast. 05/16/23   [provider]  omeprazole  (PRILOSEC) 20 MG capsule Take 1 capsule (20 mg total) by mouth daily. Patient taking differently: Take 20 mg by mouth as needed. 10/20/22 11/19/22  Arvilla Birmingham, MD  ondansetron  (ZOFRAN ) 4 MG tablet Take 1 tablet (4 mg total) by mouth every 8 (eight) hours as needed for nausea, vomiting or refractory nausea / vomiting. 05/05/23   Banga, Lum Salina, DO  oxyCODONE -acetaminophen  (PERCOCET/ROXICET) 5-325 MG tablet TAKE 1 TABLET BY MOUTH EVERY 4 (FOUR) HOURS AS NEEDED FOR UP TO 7 DAYS FOR SEVERE PAIN.    [provider]  promethazine -dextromethorphan (PROMETHAZINE -DM) 6.25-15 MG/5ML syrup Take 5 mLs by mouth 4 (four) times  daily as needed for cough. 01/12/24  Yes Rodarius Kichline, Vonzella Guernsey, MD  rosuvastatin  (CRESTOR ) 10 MG tablet Take 10 mg by mouth daily.    [provider]  valACYclovir  (VALTREX ) 500 MG tablet Take 1 tablet (500 mg total) by mouth 2 (two) times daily as needed. Patient not taking: Reported on 05/21/2023 07/22/22   Viola Greulich, MD  Vitamin D , Ergocalciferol , (DRISDOL ) 50000 units CAPS capsule Take 1 capsule (50,000 Units total) by mouth every 7 (seven) days. 08/17/16   Delsie Figures, PA-C      Allergies    Codeine    Review of Systems    Review of Systems  Constitutional:  Negative for fever.  Respiratory:  Positive for cough and shortness of breath.   Cardiovascular:  Positive for chest pain.  Gastrointestinal:  Negative for abdominal pain, nausea and vomiting.  All other systems reviewed and are negative.   Physical Exam Updated Vital Signs BP (!) 128/98 (BP Location: Left Arm)   Pulse 79   Temp 98.7 F (37.1 C) (Oral)   Resp 20   Ht 1.626 m (5\' 4" )   Wt 93.4 kg   LMP 08/23/2021 (Exact Date) Comment: Patient is unsure if she has gone through an early menopause.  SpO2 100%   BMI 35.36 kg/m  Physical Exam Vitals and nursing note reviewed.  Constitutional:      Appearance: She is well-developed. She is obese. She is not ill-appearing.  HENT:     Head: Normocephalic and atraumatic.  Eyes:     Pupils: Pupils are equal, round, and reactive to light.  Cardiovascular:     Rate and Rhythm: Normal rate and regular rhythm.     Heart sounds: Normal heart sounds.  Pulmonary:     Effort: Pulmonary effort is normal. No respiratory distress.     Breath sounds: No wheezing.     Comments: Coughing noted, occasional wheeze with cough, otherwise breath sounds clear Abdominal:     General: Bowel sounds are normal.     Palpations: Abdomen is soft.  Musculoskeletal:     Cervical back: Neck supple.  Skin:    General: Skin is warm and dry.  Neurological:     Mental Status: She is alert and oriented to person, place, and time.  Psychiatric:        Mood and Affect: Mood normal.     ED Results / Procedures / Treatments   Labs (all labs ordered are listed, but only abnormal results are displayed) Labs Reviewed  BASIC METABOLIC PANEL WITH GFR - Abnormal; Notable for the following components:      Result Value   Sodium 134 (*)    Potassium 3.1 (*)    Glucose, Bld 136 (*)    All other components within normal limits  CBC - Abnormal; Notable for the following components:   WBC 18.2 (*)    Hemoglobin 11.2 (*)    HCT  34.7 (*)    Platelets 419 (*)    All other components within normal limits  RESP PANEL BY RT-PCR (RSV, FLU A&B, COVID)  [id number]  TROPONIN I (HIGH SENSITIVITY)  TROPONIN I (HIGH SENSITIVITY)    EKG EKG Interpretation Date/Time:  Wednesday Jan 11 2024 22:25:02 EDT Ventricular Rate:  81 PR Interval:  137 QRS Duration:  91 QT Interval:  358 QTC Calculation: 416 R Axis:   69  Text Interpretation: Sinus rhythm Probable left atrial enlargement Probable left ventricular hypertrophy No significant change since last tracing Confirmed by  Donita Furrow ([id number]) on 01/11/2024 11:45:20 PM  Radiology DG Chest 2 View Result Date: 01/11/2024 CLINICAL DATA:  Chest pain EXAM: CHEST - 2 VIEW COMPARISON:  None Available. FINDINGS: The heart size and mediastinal contours are within normal limits. Both lungs are clear. The visualized skeletal structures are unremarkable. IMPRESSION: No active cardiopulmonary disease. Electronically Signed   By: Worthy Heads M.D.   On: 01/11/2024 22:46    Procedures Procedures    Medications Ordered in ED Medications  HYDROcodone -acetaminophen  (HYCET) 7.5-325 mg/15 ml solution 10 mL (10 mLs Oral Given 01/12/24 0011)  albuterol  (VENTOLIN  HFA) 108 (90 Base) MCG/ACT inhaler 4 puff (4 puffs Inhalation Given 01/12/24 0011)  aerochamber Z-Stat Plus/medium 1 each (1 each Other Given 01/12/24 0012)  ondansetron  (ZOFRAN -ODT) disintegrating tablet 4 mg (4 mg Oral Given 01/12/24 0020)    ED Course/ Medical Decision Making/ A&P                                 Medical Decision Making Amount and/or Complexity of Data Reviewed Labs: ordered. Radiology: ordered.  Risk Prescription drug management.   This patient presents to the ED for concern of cough, shortness of breath, this involves an extensive number of treatment options, and is a complaint that carries with it a high risk of complications and morbidity.  I considered the following differential and admission for this  acute, potentially life threatening condition.  The differential diagnosis includes viral URI, bronchitis, pneumonia, less likely ACS PE  MDM:    This is a 48 year old female who presents with shortness of breath, cough.  Had a telehealth visit on Monday.  She is taking azithromycin and steroids.  She continues to smoke.  She is afebrile nontoxic-appearing.  Normal oxygen saturations.  She does have an occasional cough and wheezes when coughing but is otherwise clear with breath sounds.  Chest x-ray shows no evidence of pneumothorax or pneumonia.  EKG no evidence of arrhythmia or ischemia.  Labs reviewed and are reassuring including troponin x 2.  COVID and influenza are negative.  Suspect acute bronchitis.  She is being appropriately treated with steroids.  She already has started antibiotics that would cover for any bacterial or atypical infection.  I would favor a viral infection.  She was given an inhaler with a spacer.  Will add promethazine -dextromethorphan.  Encouraged her to stop smoking.  On recheck after inhaler with spacer, patient states she feels somewhat better.  Breath sounds remain mostly clear.  (Labs, imaging, consults)  Labs: I Ordered, and personally interpreted labs.  The pertinent results include: CBC, BMP, troponin x 2, COVID and influenza  Imaging Studies ordered: I ordered imaging studies including chest x-ray I independently visualized and interpreted imaging. I agree with the radiologist interpretation  Additional history obtained from chart review.  External records from outside source obtained and reviewed including prior evaluations  Cardiac Monitoring: The patient was maintained on a cardiac monitor.  If on the cardiac monitor, I personally viewed and interpreted the cardiac monitored which showed an underlying rhythm of: Sinus  Reevaluation: After the interventions noted above, I reevaluated the patient and found that they have :improved  Social Determinants of  Health:  lives independently  Disposition: Discharge  Co morbidities that complicate the patient evaluation  Past Medical History:  Diagnosis Date   Anxiety 2013   resolved per pt   Asthma    Follows w/ Aleda Hurl, PA.  Back pain    Muscle spasms   Chronic constipation    Follows w/ GI.   Constipation    w/ right lower quadrant pain   DM (diabetes mellitus), type 2 (HCC)    Follows w/ PCP.   Hyperlipidemia    Follows w/ PCP, Aleda Hurl, PA.   Hypertension    Right lower quadrant pain 2024   severe, per pt, unsure if GI related   Wears contact lenses    Wears glasses      Medicines Meds ordered this encounter  Medications   HYDROcodone-acetaminophen  (HYCET) 7.5-325 mg/15 ml solution 10 mL    Refill:  0   albuterol  (VENTOLIN  HFA) 108 (90 Base) MCG/ACT inhaler 4 puff   aerochamber Z-Stat Plus/medium 1 each   ondansetron  (ZOFRAN -ODT) disintegrating tablet 4 mg   promethazine -dextromethorphan (PROMETHAZINE -DM) 6.25-15 MG/5ML syrup    Sig: Take 5 mLs by mouth 4 (four) times daily as needed for cough.    Dispense:  118 mL    Refill:  0    I have reviewed the patients home medicines and have made adjustments as needed  Problem List / ED Course: Problem List Items Addressed This Visit   None Visit Diagnoses       Acute bronchitis, unspecified organism    -  Primary                 Final Clinical Impression(s) / ED Diagnoses Final diagnoses:  Acute bronchitis, unspecified organism    Rx / DC Orders ED Discharge Orders          Ordered    promethazine -dextromethorphan (PROMETHAZINE -DM) 6.25-15 MG/5ML syrup  4 times daily PRN        01/12/24 0159              Rory Collard, MD 01/12/24 [phone]

## 2024-01-12 NOTE — Discharge Instructions (Signed)
 You were seen today for ongoing cough and shortness of breath.  Your workup is reassuring.  No pneumonia.  COVID and influenza testing are negative.  This is likely viral bronchitis.  If you can abstain from smoking this will be very helpful.  Continue your medications at home as well as the inhaler provided.  You will be given a different cough medication.

## 2024-03-21 ENCOUNTER — Other Ambulatory Visit: Payer: Self-pay | Admitting: Gastroenterology
